# Patient Record
Sex: Male | Born: 1990 | Hispanic: Yes | Marital: Single | Smoking: Never smoker
Health system: Southern US, Community
[De-identification: ages and names within clinical notes are randomized; demographics above are authoritative.]

## PROBLEM LIST (undated history)

## (undated) DIAGNOSIS — T79A21A Traumatic compartment syndrome of right lower extremity, initial encounter: Secondary | ICD-10-CM

## (undated) DIAGNOSIS — S82251A Displaced comminuted fracture of shaft of right tibia, initial encounter for closed fracture: Secondary | ICD-10-CM

## (undated) HISTORY — DX: Traumatic compartment syndrome of right lower extremity, initial encounter: T79.A21A

## (undated) HISTORY — DX: Displaced comminuted fracture of shaft of right tibia, initial encounter for closed fracture: S82.251A

---

## 2010-01-06 ENCOUNTER — Emergency Department (HOSPITAL_COMMUNITY): Admission: EM | Admit: 2010-01-06 | Discharge: 2010-01-07 | Payer: Self-pay | Admitting: Emergency Medicine

## 2010-02-11 ENCOUNTER — Emergency Department (HOSPITAL_COMMUNITY): Admission: EM | Admit: 2010-02-11 | Discharge: 2010-02-12 | Payer: Self-pay | Admitting: Emergency Medicine

## 2011-02-01 LAB — CBC
HCT: 45.8 % (ref 39.0–52.0)
MCV: 90.2 fL (ref 78.0–100.0)
RBC: 5.07 MIL/uL (ref 4.22–5.81)
WBC: 5.7 10*3/uL (ref 4.0–10.5)

## 2011-02-01 LAB — RAPID URINE DRUG SCREEN, HOSP PERFORMED
Amphetamines: NOT DETECTED
Barbiturates: NOT DETECTED
Benzodiazepines: NOT DETECTED
Opiates: NOT DETECTED

## 2011-02-01 LAB — URINALYSIS, ROUTINE W REFLEX MICROSCOPIC
Bilirubin Urine: NEGATIVE
Protein, ur: NEGATIVE mg/dL
Urobilinogen, UA: 0.2 mg/dL (ref 0.0–1.0)

## 2011-02-01 LAB — BASIC METABOLIC PANEL
Chloride: 103 mEq/L (ref 96–112)
GFR calc Af Amer: >60 mL/min (ref >60)
Potassium: 3.7 mEq/L (ref 3.5–5.1)
Sodium: 139 mEq/L (ref 135–145)

## 2011-02-01 LAB — DIFFERENTIAL
Eosinophils Absolute: 0 10*3/uL (ref 0.0–0.7)
Eosinophils Relative: 0 % (ref 0–5)
Lymphs Abs: 1.4 10*3/uL (ref 0.7–4.0)
Monocytes Relative: 9 % (ref 3–12)

## 2011-02-01 LAB — ETHANOL: Alcohol, Ethyl (B): <5 mg/dL (ref 0–10)

## 2011-02-03 LAB — BASIC METABOLIC PANEL
CO2: 26 mEq/L (ref 19–32)
Chloride: 103 mEq/L (ref 96–112)
Creatinine, Ser: 0.89 mg/dL (ref 0.4–1.5)
GFR calc Af Amer: >60 mL/min (ref >60)
Potassium: 3.4 mEq/L — ABNORMAL LOW (ref 3.5–5.1)

## 2011-02-03 LAB — RAPID URINE DRUG SCREEN, HOSP PERFORMED
Amphetamines: NOT DETECTED
Opiates: NOT DETECTED
Tetrahydrocannabinol: POSITIVE — AB

## 2011-02-03 LAB — CBC
HCT: 46.8 % (ref 39.0–52.0)
MCHC: 34.1 g/dL (ref 30.0–36.0)
MCV: 91.9 fL (ref 78.0–100.0)
RBC: 5.09 MIL/uL (ref 4.22–5.81)

## 2011-02-03 LAB — DIFFERENTIAL
Basophils Relative: 1 % (ref 0–1)
Eosinophils Absolute: 0 10*3/uL (ref 0.0–0.7)
Eosinophils Relative: 0 % (ref 0–5)
Monocytes Absolute: 0.3 10*3/uL (ref 0.1–1.0)
Monocytes Relative: 6 % (ref 3–12)
Neutrophils Relative %: 72 % (ref 43–77)

## 2011-09-22 ENCOUNTER — Emergency Department (HOSPITAL_COMMUNITY): Payer: Medicaid Other

## 2011-09-22 ENCOUNTER — Encounter: Payer: Self-pay | Admitting: *Deleted

## 2011-09-22 ENCOUNTER — Emergency Department (HOSPITAL_COMMUNITY)
Admission: EM | Admit: 2011-09-22 | Discharge: 2011-09-22 | Disposition: A | Discharge status: Home / Self Care | Payer: Medicaid Other | Attending: Emergency Medicine | Admitting: Emergency Medicine

## 2011-09-22 DIAGNOSIS — S8392XA Sprain of unspecified site of left knee, initial encounter: Secondary | ICD-10-CM

## 2011-09-22 DIAGNOSIS — X58XXXA Exposure to other specified factors, initial encounter: Secondary | ICD-10-CM | POA: Insufficient documentation

## 2011-09-22 DIAGNOSIS — IMO0002 Reserved for concepts with insufficient information to code with codable children: Secondary | ICD-10-CM | POA: Insufficient documentation

## 2011-09-22 MED ORDER — DICLOFENAC SODIUM 75 MG PO TBEC: 75.0000 mg | DELAYED_RELEASE_TABLET | Freq: Two times a day (BID) | ORAL | Status: DC

## 2011-09-22 NOTE — ED Notes (Signed)
Pt a/ox4. Resp even and unlabored. NAD at this time. D/C instructions and Rx x1 reviewed with pt. Pt verbalized understanding. Pt ambulated to POV with steady gate.  

## 2011-09-22 NOTE — ED Notes (Signed)
Pt states left knee pain after playing basketball last night. States he landed wrong and heard a pop.

## 2011-09-22 NOTE — ED Notes (Signed)
Knee immobilizer applied to left knee.  

## 2011-09-22 NOTE — ED Provider Notes (Signed)
History     CSN: [id number] Arrival date & time: 09/22/2011  3:50 PM   First MD Initiated Contact with Patient 09/22/11 1602      Chief Complaint  Patient presents with  . Knee Pain    (Consider location/radiation/quality/duration/timing/severity/associated sxs/prior treatment) Patient is a 20 y.o. male presenting with knee pain. The history is provided by the patient.  Knee Pain This is a new problem. The current episode started yesterday. The problem has been unchanged. Associated symptoms include joint swelling. Pertinent negatives include no abdominal pain, arthralgias, chest pain, coughing, fever or neck pain. The symptoms are aggravated by bending, standing and walking. He has tried nothing for the symptoms.    History reviewed. No pertinent past medical history.  History reviewed. No pertinent past surgical history.  No family history on file.  History  Substance Use Topics  . Smoking status: Never Smoker   . Smokeless tobacco: Not on file  . Alcohol Use: No      Review of Systems  Constitutional: Negative for fever and activity change.       All ROS Neg except as noted in HPI  HENT: Negative for nosebleeds and neck pain.   Eyes: Negative for photophobia and discharge.  Respiratory: Negative for cough, shortness of breath and wheezing.   Cardiovascular: Negative for chest pain and palpitations.  Gastrointestinal: Negative for abdominal pain and blood in stool.  Genitourinary: Negative for dysuria, frequency and hematuria.  Musculoskeletal: Positive for joint swelling. Negative for back pain and arthralgias.  Skin: Negative.   Neurological: Negative for dizziness, seizures and speech difficulty.  Psychiatric/Behavioral: Negative for hallucinations and confusion.    Allergies  Review of patient's allergies indicates no known allergies.  Home Medications  No current outpatient prescriptions on file.  BP 134/55  Pulse 67  Temp(Src) 98.8 F (37.1 C) (Oral)   Resp 16  Ht 5\' 6"  (1.676 m)  Wt 198 lb (89.812 kg)  BMI 31.96 kg/m2  SpO2 100%  Physical Exam  Nursing note and vitals reviewed. Constitutional: He is oriented to person, place, and time. He appears well-developed and well-nourished.  Non-toxic appearance.  HENT:  Head: Normocephalic.  Right Ear: Tympanic membrane and external ear normal.  Left Ear: Tympanic membrane and external ear normal.  Eyes: EOM and lids are normal. Pupils are equal, round, and reactive to light.  Neck: Normal range of motion. Neck supple. Carotid bruit is not present.  Cardiovascular: Normal rate, regular rhythm, normal heart sounds, intact distal pulses and normal pulses.   Pulmonary/Chest: Breath sounds normal. No respiratory distress.  Abdominal: Soft. Bowel sounds are normal. There is no tenderness. There is no guarding.  Musculoskeletal: Normal range of motion.       Mild effusion noted or the left knee.. Not hot. Pain with flexing the left knee. No posterior mass. No significant instability on limited exam. Distal pulse and sensory wnl.  Lymphadenopathy:       Head (right side): No submandibular adenopathy present.       Head (left side): No submandibular adenopathy present.    He has no cervical adenopathy.  Neurological: He is alert and oriented to person, place, and time. He has normal strength. No cranial nerve deficit or sensory deficit.  Skin: Skin is warm and dry.  Psychiatric: He has a normal mood and affect. His speech is normal.    ED Course: Pt to use knee immobilizer for 7 to 10 days. He is to see orthopedics if not improving. Rx  for diclofenac given.  Procedures (including critical care time)  Labs Reviewed - No data to display Dg Knee Complete 4 Views Left  09/22/2011  *RADIOLOGY REPORT*  Clinical Data: Basketball injury, left knee pain  LEFT KNEE - COMPLETE 4+ VIEW  Comparison: None.  Findings: No fracture or dislocation of the left knee.  There is a small suprapatellar joint effusion.   IMPRESSION:  1.  No evidence of fracture. 2. Small l effusion.  Original Report Authenticated By: Genevive Bi, M.D.     Dx: Left knee strain   MDM  I have reviewed nursing notes, vital signs, and all appropriate lab and imaging results for this patient.        Kathie Dike, [place] 09/22/11 1719

## 2011-09-22 NOTE — ED Provider Notes (Signed)
Medical screening examination/treatment/procedure(s) were performed by non-physician practitioner and as supervising physician I was immediately available for consultation/collaboration.   Joya Gaskins, MD 09/22/11 [phone]

## 2012-03-25 ENCOUNTER — Encounter (HOSPITAL_COMMUNITY): Payer: Self-pay | Admitting: *Deleted

## 2012-03-25 ENCOUNTER — Emergency Department (HOSPITAL_COMMUNITY)
Admission: EM | Admit: 2012-03-25 | Discharge: 2012-03-25 | Disposition: A | Discharge status: Home / Self Care | Payer: Self-pay | Attending: Emergency Medicine | Admitting: Emergency Medicine

## 2012-03-25 DIAGNOSIS — L309 Dermatitis, unspecified: Secondary | ICD-10-CM

## 2012-03-25 DIAGNOSIS — L259 Unspecified contact dermatitis, unspecified cause: Secondary | ICD-10-CM | POA: Insufficient documentation

## 2012-03-25 MED ORDER — AMOXICILLIN-POT CLAVULANATE 875-125 MG PO TABS
1.0000 | ORAL_TABLET | Freq: Once | ORAL | Status: AC
  Administered 2012-03-25: 1 via ORAL
  Filled 2012-03-25: qty 1

## 2012-03-25 MED ORDER — AMOXICILLIN-POT CLAVULANATE 875-125 MG PO TABS: 1.0000 | ORAL_TABLET | Freq: Two times a day (BID) | ORAL | Status: AC

## 2012-03-25 MED ORDER — TRIAMCINOLONE ACETONIDE 0.5 % EX CREA: TOPICAL_CREAM | Freq: Three times a day (TID) | CUTANEOUS | Status: AC

## 2012-03-25 NOTE — Discharge Instructions (Signed)
Dermatitis Hand Dermatitis Hand dermatitis is a skin problem. Small, itchy, raised dots or blisters appear on the palms of the hands. Hand dermatitis can last 3 to 4 weeks. HOME CARE  Avoid washing your hands too much.   Avoid all harsh chemicals. Wear gloves when you use products that can bother your skin.   Use medicated cream (1% hydrocortisone cream) at least 2 to 4 times per day.   Only take medicine as told by your doctor.   You may use wet cloths (compresses) or cold packs.  GET HELP RIGHT AWAY IF:   The rash is not better after 1 week of treatment.   The area is red, tender, or yellowish-white fluid (pus) comes from the wound.   The rash is spreading.  MAKE SURE YOU:   Understand these instructions.   Will watch your condition.   Will get help right away if you are not doing well or get worse.  Document Released: 01/24/2010 Document Revised: 10/19/2011 Document Reviewed: 03/29/2011 [facility] Patient Information 2012 [place], [place].   Apply the triamcinolone cream as directed after washing hands with soap and water.  The antibiotic prescribed is to hopefully prevent any secondary infection from developing.  Call [facility] at [phone] and make an appt for further evaluation.

## 2012-03-25 NOTE — ED Notes (Signed)
Pt with swelling noted to right hand fingers, pt states he got an abrasion while playing basketball 2-3 weeks ago, hx of same about 8 months ago per pt and was seen at Urgent care x 2 for it and it went away per pt., c/o itching and burning to palm of right hand and fingers

## 2012-03-25 NOTE — ED Provider Notes (Signed)
History     CSN: [id number]  Arrival date & time 03/25/12  1650   First MD Initiated Contact with Patient 03/25/12 1914      Chief Complaint  Patient presents with  . Rash    (Consider location/radiation/quality/duration/timing/severity/associated sxs/prior treatment) HPI Comments: States this is the third or fourth flare-up with the hand rash in about 1.5 yrs.   No known allergen contact.  Has been treating with daily hydrogen peroxide washes and benadryl cream without improvement.    Patient is a 21 y.o. male presenting with rash. The history is provided by the patient. No language interpreter was used.  Rash  This is a recurrent problem.    History reviewed. No pertinent past medical history.  History reviewed. No pertinent past surgical history.  History reviewed. No pertinent family history.  History  Substance Use Topics  . Smoking status: Never Smoker   . Smokeless tobacco: Not on file  . Alcohol Use: No      Review of Systems  Constitutional: Negative for fever and chills.  Skin: Positive for rash.  All other systems reviewed and are negative.    Allergies  Review of patient's allergies indicates no known allergies.  Home Medications   Current Outpatient Rx  Name Route Sig Dispense Refill  . AMOXICILLIN-POT CLAVULANATE 875-125 MG PO TABS Oral Take 1 tablet by mouth every 12 (twelve) hours. 14 tablet 0  . TRIAMCINOLONE ACETONIDE 0.5 % EX CREA Topical Apply topically 3 (three) times daily. 30 g 0    BP 144/74  Pulse 70  Temp(Src) 98.2 F (36.8 C) (Oral)  Resp 20  Ht 5\' 6"  (1.676 m)  Wt 185 lb (83.915 kg)  BMI 29.86 kg/m2  SpO2 100%  Physical Exam  Nursing note and vitals reviewed. Constitutional: He is oriented to person, place, and time. He appears well-developed and well-nourished.  HENT:  Head: Normocephalic and atraumatic.  Eyes: EOM are normal.  Neck: Normal range of motion.  Cardiovascular: Normal rate, regular rhythm, normal heart  sounds and intact distal pulses.   Pulmonary/Chest: Effort normal and breath sounds normal. No respiratory distress.  Abdominal: Soft. He exhibits no distension. There is no tenderness.  Musculoskeletal:       Right shoulder: He exhibits no bony tenderness.       Right hand: He exhibits decreased range of motion, tenderness and swelling. He exhibits no bony tenderness, normal two-point discrimination, normal capillary refill, no deformity and no laceration.       Hands:      Tiny blistery lesions which are nearly confluent on an erythematous base.  + pruritic.  No evidence of secondary infection.  + moderate finger swelling.  Neurological: He is alert and oriented to person, place, and time.  Skin: Skin is warm and dry. Rash noted.  Psychiatric: He has a normal mood and affect. Judgment normal.    ED Course  Procedures (including critical care time)  Labs Reviewed - No data to display No results found.   1. Dermatitis       MDM  rx triamcinolone cream, 0.5 % BID.  Wash BID.  Referral to dermatology, dr. Margo Aye.        Worthy Rancher, PA 03/25/12 2012  Worthy Rancher, PA 03/25/12 2014

## 2012-03-25 NOTE — ED Notes (Signed)
Bil hand rash that itches.

## 2012-03-28 NOTE — ED Provider Notes (Signed)
Medical screening examination/treatment/procedure(s) were performed by non-physician practitioner and as supervising physician I was immediately available for consultation/collaboration.  Remmy Crass, MD 03/28/12 0716 

## 2013-09-01 ENCOUNTER — Emergency Department (HOSPITAL_COMMUNITY): Payer: No Typology Code available for payment source

## 2013-09-01 ENCOUNTER — Emergency Department (HOSPITAL_COMMUNITY)
Admission: EM | Admit: 2013-09-01 | Discharge: 2013-09-01 | Disposition: A | Discharge status: Home / Self Care | Payer: No Typology Code available for payment source | Attending: Emergency Medicine | Admitting: Emergency Medicine

## 2013-09-01 ENCOUNTER — Encounter (HOSPITAL_COMMUNITY): Payer: Self-pay | Admitting: Emergency Medicine

## 2013-09-01 DIAGNOSIS — S0081XA Abrasion of other part of head, initial encounter: Secondary | ICD-10-CM

## 2013-09-01 DIAGNOSIS — Y9389 Activity, other specified: Secondary | ICD-10-CM | POA: Insufficient documentation

## 2013-09-01 DIAGNOSIS — S0033XA Contusion of nose, initial encounter: Secondary | ICD-10-CM

## 2013-09-01 DIAGNOSIS — IMO0002 Reserved for concepts with insufficient information to code with codable children: Secondary | ICD-10-CM | POA: Insufficient documentation

## 2013-09-01 DIAGNOSIS — S0003XA Contusion of scalp, initial encounter: Secondary | ICD-10-CM | POA: Insufficient documentation

## 2013-09-01 DIAGNOSIS — Y92410 Unspecified street and highway as the place of occurrence of the external cause: Secondary | ICD-10-CM | POA: Insufficient documentation
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
## 2013-09-01 NOTE — ED Notes (Signed)
Discharge instructions given and reviewed with patient.  Patient verbalized understanding to use ice and Ibuprofen as directed.  Patient ambulatory; discharged home in good condition.
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
## 2013-09-11 ENCOUNTER — Emergency Department (HOSPITAL_COMMUNITY)
Admission: EM | Admit: 2013-09-11 | Discharge: 2013-09-11 | Disposition: A | Discharge status: Home / Self Care | Payer: No Typology Code available for payment source | Attending: Emergency Medicine | Admitting: Emergency Medicine

## 2013-09-11 ENCOUNTER — Encounter (HOSPITAL_COMMUNITY): Payer: Self-pay | Admitting: Emergency Medicine

## 2013-09-11 DIAGNOSIS — M25569 Pain in unspecified knee: Secondary | ICD-10-CM | POA: Insufficient documentation

## 2013-09-11 DIAGNOSIS — M25561 Pain in right knee: Secondary | ICD-10-CM

## 2013-09-11 DIAGNOSIS — G8911 Acute pain due to trauma: Secondary | ICD-10-CM | POA: Insufficient documentation

## 2013-09-11 NOTE — ED Notes (Signed)
Patient with no complaints at this time. Respirations even and unlabored. Skin warm/dry. Discharge instructions reviewed with patient at this time. Patient given opportunity to voice concerns/ask questions. Patient discharged at this time and left Emergency Department with steady gait.   

## 2013-09-11 NOTE — ED Notes (Signed)
Wants recheck from [facility] on 09-01-2013
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
## 2013-09-12 NOTE — ED Provider Notes (Signed)
Medical screening examination/treatment/procedure(s) were performed by non-physician practitioner and as supervising physician I was immediately available for consultation/collaboration.  EKG Interpretation   None        Braison Snoke, MD 09/12/13 2342 

## 2013-10-06 ENCOUNTER — Ambulatory Visit: Payer: Self-pay | Admitting: Orthopedic Surgery

## 2013-10-14 ENCOUNTER — Ambulatory Visit (INDEPENDENT_AMBULATORY_CARE_PROVIDER_SITE_OTHER): Payer: No Typology Code available for payment source

## 2013-10-14 ENCOUNTER — Encounter: Payer: Self-pay | Admitting: Orthopedic Surgery

## 2013-10-14 ENCOUNTER — Ambulatory Visit (INDEPENDENT_AMBULATORY_CARE_PROVIDER_SITE_OTHER): Payer: Self-pay | Admitting: Orthopedic Surgery

## 2013-10-14 VITALS — BP 128/102 | Ht 66.0 in | Wt 215.0 lb

## 2013-10-14 DIAGNOSIS — M25561 Pain in right knee: Secondary | ICD-10-CM

## 2013-10-14 DIAGNOSIS — M25569 Pain in unspecified knee: Secondary | ICD-10-CM

## 2013-10-14 MED ORDER — NAPROXEN 500 MG PO TABS: 500.0000 mg | ORAL_TABLET | Freq: Two times a day (BID) | ORAL | Status: DC
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
## 2013-10-14 NOTE — Patient Instructions (Addendum)
Call to [facility] to arrange therapy Pick up your prescription at [facility]

## 2013-10-22 ENCOUNTER — Ambulatory Visit (HOSPITAL_COMMUNITY): Payer: Self-pay | Admitting: Physical Therapy

## 2013-10-29 ENCOUNTER — Ambulatory Visit (HOSPITAL_COMMUNITY)
Admission: RE | Admit: 2013-10-29 | Discharge: 2013-10-29 | Disposition: A | Discharge status: Home / Self Care | Payer: No Typology Code available for payment source | Source: Ambulatory Visit | Attending: Orthopedic Surgery | Admitting: Orthopedic Surgery

## 2013-10-29 DIAGNOSIS — M25569 Pain in unspecified knee: Secondary | ICD-10-CM | POA: Insufficient documentation

## 2013-10-29 DIAGNOSIS — R269 Unspecified abnormalities of gait and mobility: Secondary | ICD-10-CM | POA: Insufficient documentation

## 2013-10-29 DIAGNOSIS — IMO0001 Reserved for inherently not codable concepts without codable children: Secondary | ICD-10-CM | POA: Insufficient documentation
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
## 2014-11-30 ENCOUNTER — Emergency Department (HOSPITAL_COMMUNITY)
Admission: EM | Admit: 2014-11-30 | Discharge: 2014-11-30 | Disposition: A | Discharge status: Home / Self Care | Payer: 59 | Attending: Emergency Medicine | Admitting: Emergency Medicine

## 2014-11-30 ENCOUNTER — Encounter (HOSPITAL_COMMUNITY): Payer: Self-pay | Admitting: Emergency Medicine

## 2014-11-30 DIAGNOSIS — Y9289 Other specified places as the place of occurrence of the external cause: Secondary | ICD-10-CM | POA: Insufficient documentation

## 2014-11-30 DIAGNOSIS — Y9389 Activity, other specified: Secondary | ICD-10-CM | POA: Insufficient documentation

## 2014-11-30 DIAGNOSIS — Y998 Other external cause status: Secondary | ICD-10-CM | POA: Diagnosis not present

## 2014-11-30 DIAGNOSIS — Z791 Long term (current) use of non-steroidal anti-inflammatories (NSAID): Secondary | ICD-10-CM | POA: Diagnosis not present

## 2014-11-30 DIAGNOSIS — S29012A Strain of muscle and tendon of back wall of thorax, initial encounter: Secondary | ICD-10-CM | POA: Diagnosis not present

## 2014-11-30 DIAGNOSIS — S4992XA Unspecified injury of left shoulder and upper arm, initial encounter: Secondary | ICD-10-CM | POA: Insufficient documentation

## 2014-11-30 DIAGNOSIS — S299XXA Unspecified injury of thorax, initial encounter: Secondary | ICD-10-CM | POA: Diagnosis present

## 2014-11-30 DIAGNOSIS — X58XXXA Exposure to other specified factors, initial encounter: Secondary | ICD-10-CM | POA: Diagnosis not present

## 2014-11-30 MED ORDER — TRAMADOL HCL 50 MG PO TABS: 50.0000 mg | ORAL_TABLET | Freq: Four times a day (QID) | ORAL | Status: DC | PRN

## 2014-11-30 MED ORDER — CYCLOBENZAPRINE HCL 10 MG PO TABS: 10.0000 mg | ORAL_TABLET | Freq: Three times a day (TID) | ORAL | Status: DC | PRN

## 2014-11-30 MED ORDER — IBUPROFEN 800 MG PO TABS: 800.0000 mg | ORAL_TABLET | Freq: Three times a day (TID) | ORAL | Status: DC

## 2014-11-30 NOTE — ED Notes (Signed)
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

## 2014-11-30 NOTE — ED Notes (Signed)
Pt alert & oriented x4, stable gait. Patient given discharge instructions, paperwork & prescription(s). Patient  instructed to stop at the registration desk to finish any additional paperwork. Patient verbalized understanding. Pt left department w/ no further questions. 

## 2014-11-30 NOTE — Discharge Instructions (Signed)

## 2014-11-30 NOTE — ED Provider Notes (Signed)
CSN: [id number]     Arrival date & time 11/30/14  1518 History  This chart was scribed for non-physician practitioner, Pauline Aus, PA-C, working with Juliet Rude. Rubin Payor, MD, by Ronney Lion, ED Scribe. This patient was seen in room [facility] and the patient's care was started at 4:29 PM.    Chief Complaint  Patient presents with  . Back Pain   Patient is a 24 y.o. male presenting with back pain. The history is provided by the patient. No language interpreter was used.  Back Pain Location:  Thoracic spine Quality:  Aching Radiates to:  Does not radiate Pain severity:  Mild Onset quality:  Gradual Duration:  6 months Timing:  Intermittent Progression:  Worsening Chronicity:  Chronic Relieved by:  Bed rest, heating pad and cold packs Worsened by:  Nothing tried Ineffective treatments:  None tried Associated symptoms: no abdominal pain, no dysuria, no fever, no numbness and no weakness      HPI Comments: Carlos Kim is a 24 y.o. male who presents to the Emergency Department complaining of non-radiating, intermittent left mid back pain that has been ongoing since July 2015. It is usually alleviated after a weekend of rest, but it persisted this weekend. Patient complains of  sore pain in his left shoulder, and bilateral rib pain exacerbated by stretching. Bending over or straightening his back exacerbates his back pain. Deep inspiration doesn't affect it. He denies trying any medication. Patient does heavy lifting for work at a [employer]. He denies SOB, fever, chills, urinary or bladder changes, neck pain or lower extremity weakness.    History reviewed. No pertinent past medical history. No past surgical history on file. No family history on file. History  Substance Use Topics  . Smoking status: Never Smoker   . Smokeless tobacco: Not on file  . Alcohol Use: Yes     Comment: socially    Review of Systems  Constitutional: Negative  for fever.  Respiratory: Negative for shortness of breath.   Gastrointestinal: Negative for vomiting, abdominal pain and constipation.  Genitourinary: Negative for dysuria, hematuria, flank pain, decreased urine volume and difficulty urinating.  Musculoskeletal: Positive for myalgias and back pain. Negative for joint swelling.  Skin: Negative for rash.  Neurological: Negative for weakness and numbness.  All other systems reviewed and are negative.     Allergies  Review of patient's allergies indicates no known allergies.  Home Medications   Prior to Admission medications   Medication Sig Start Date End Date Taking? Authorizing Provider  naproxen (NAPROSYN) 500 MG tablet Take 1 tablet (500 mg total) by mouth 2 (two) times daily with a meal. Patient not taking: Reported on 11/30/2014 10/14/13   Vickki Hearing, MD   BP 132/78 mmHg  Pulse 92  Temp(Src) 99.7 F (37.6 C) (Oral)  Resp 18  Ht  (1.676 m)  Wt 185 lb (83.915 kg)  BMI 29.87 kg/m2  SpO2 100% Physical Exam  Constitutional: He is oriented to person, place, and time. He appears well-developed and well-nourished. No distress.  HENT:  Head: Normocephalic and atraumatic.  Eyes: Conjunctivae and EOM are normal.  Neck: Neck supple. No tracheal deviation present.  Cardiovascular: Normal rate, regular rhythm and normal heart sounds.   Pulmonary/Chest: Effort normal. No respiratory distress.  Musculoskeletal: Normal range of motion. He exhibits tenderness.  Tenderness with rotation of left shoulder. Tenderness to left scapula border. No spinal tenderness.  Neurological: He is alert and oriented to person, place, and  time.  Skin: Skin is warm and dry.  Psychiatric: He has a normal mood and affect. His behavior is normal.  Nursing note and vitals reviewed.   ED Course  Procedures (including critical care time)  DIAGNOSTIC STUDIES: Oxygen Saturation is 100% on room air, normal by my interpretation.    COORDINATION OF  CARE: 4:36 PM - Discussed treatment plan with pt at bedside which includes ice, anti-inflammatory medication, and pain medication, and pt agreed to plan. Patient advised to return if he umbness, weakness   MDM   Final diagnoses:  Muscle strain of left upper back, initial encounter   Pt is well appearing, VSS.  Pain likely related to musculoskeletal injury.  Agrees to symptomatic tx and close PMD f/u or to return here if sx's worsen.   I personally performed the services described in this documentation, which was scribed in my presence. The recorded information has been reviewed and is accurate.    Odester Nilson L. Trisha Mangleriplett, PA-C 12/03/14 1419  Nathan R. Rubin PayorPickering, MD 12/04/14 2249

## 2016-12-27 ENCOUNTER — Emergency Department (HOSPITAL_COMMUNITY)
Admission: EM | Admit: 2016-12-27 | Discharge: 2016-12-27 | Disposition: A | Discharge status: Home / Self Care | Payer: 59 | Attending: Emergency Medicine | Admitting: Emergency Medicine

## 2016-12-27 ENCOUNTER — Encounter (HOSPITAL_COMMUNITY): Payer: Self-pay | Admitting: Emergency Medicine

## 2016-12-27 ENCOUNTER — Emergency Department (HOSPITAL_COMMUNITY): Payer: 59

## 2016-12-27 DIAGNOSIS — Y998 Other external cause status: Secondary | ICD-10-CM | POA: Insufficient documentation

## 2016-12-27 DIAGNOSIS — X58XXXA Exposure to other specified factors, initial encounter: Secondary | ICD-10-CM | POA: Insufficient documentation

## 2016-12-27 DIAGNOSIS — S8392XA Sprain of unspecified site of left knee, initial encounter: Secondary | ICD-10-CM | POA: Insufficient documentation

## 2016-12-27 DIAGNOSIS — Y929 Unspecified place or not applicable: Secondary | ICD-10-CM | POA: Insufficient documentation

## 2016-12-27 DIAGNOSIS — Y9339 Activity, other involving climbing, rappelling and jumping off: Secondary | ICD-10-CM | POA: Insufficient documentation

## 2016-12-27 MED ORDER — IBUPROFEN 800 MG PO TABS: 800.0000 mg | ORAL_TABLET | Freq: Three times a day (TID) | ORAL | 0 refills | Status: DC

## 2016-12-27 NOTE — Discharge Instructions (Signed)
Elevate apply ice packs on/off to your knee.  Minimal weight bearing.  Call one of the providers listed to arrange a follow-up appt.

## 2016-12-27 NOTE — ED Triage Notes (Signed)
Pt c/o left knee pain and swelling since jumping off back of a truck on Monday.
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
## 2017-09-09 ENCOUNTER — Encounter (HOSPITAL_COMMUNITY): Payer: Self-pay

## 2017-09-09 ENCOUNTER — Emergency Department (HOSPITAL_COMMUNITY)
Admission: EM | Admit: 2017-09-09 | Discharge: 2017-09-09 | Disposition: A | Discharge status: Home / Self Care | Payer: 59 | Attending: Emergency Medicine | Admitting: Emergency Medicine

## 2017-09-09 DIAGNOSIS — L237 Allergic contact dermatitis due to plants, except food: Secondary | ICD-10-CM | POA: Insufficient documentation

## 2017-09-09 DIAGNOSIS — Z79899 Other long term (current) drug therapy: Secondary | ICD-10-CM | POA: Diagnosis not present

## 2017-09-09 DIAGNOSIS — R600 Localized edema: Secondary | ICD-10-CM | POA: Diagnosis present

## 2017-09-09 DIAGNOSIS — L255 Unspecified contact dermatitis due to plants, except food: Secondary | ICD-10-CM

## 2017-09-09 MED ORDER — PREDNISONE 10 MG PO TABS: ORAL_TABLET | ORAL | 0 refills | Status: DC

## 2017-09-09 MED ORDER — PREDNISONE 50 MG PO TABS
60.0000 mg | ORAL_TABLET | Freq: Once | ORAL | Status: AC
  Administered 2017-09-09: 60 mg via ORAL
  Filled 2017-09-09: qty 1

## 2017-09-09 NOTE — ED Triage Notes (Signed)
Patient reports of possible exposure to poison oak Thursday and started with rash yesterday. States he woke up with swelling to face this morning. Denies any respiratory issues.

## 2017-09-09 NOTE — ED Provider Notes (Signed)
[facility] Provider Note   CSN: [id number] Arrival date & time: 09/09/17  0703     History   Chief Complaint Chief Complaint  Patient presents with  . Facial Swelling    HPI Lorelee MarketDelfino E Sellin is a 26 y.o. male.  HPI  Pt was seen at 0720. Per pt, c/o gradual onset and persistence of constant "rash" since yesterday. Before the rash began, pt states he was outside "chopping up trees" and was probably exposed to poison ivy/oak. States the rash started on his volar forearms and now has spread to his face. Denies SOB/wheezing, no intra-oral edema, no stridor.   History reviewed. No pertinent past medical history.  Patient Active Problem List   Diagnosis Date Noted  . Bilateral anterior knee pain 10/14/2013  . Bilateral knee pain 10/14/2013    History reviewed. No pertinent surgical history.     Home Medications    Prior to Admission medications   Medication Sig Start Date End Date Taking? Authorizing Provider  cyclobenzaprine (FLEXERIL) 10 MG tablet Take 1 tablet (10 mg total) by mouth 3 (three) times daily as needed. 11/30/14   Triplett, Tammy, PA-C  ibuprofen (ADVIL,MOTRIN) 800 MG tablet Take 1 tablet (800 mg total) by mouth 3 (three) times daily. 12/27/16   Triplett, Tammy, PA-C  traMADol (ULTRAM) 50 MG tablet Take 1 tablet (50 mg total) by mouth every 6 (six) hours as needed. 11/30/14   Pauline Ausriplett, Tammy, PA-C    Family History No family history on file.  Social History Social History  Substance Use Topics  . Smoking status: Never Smoker  . Smokeless tobacco: Never Used  . Alcohol use Yes     Comment: socially     Allergies   Patient has no known allergies.   Review of Systems Review of Systems ROS: Statement: All systems negative except as marked or noted in the HPI; Constitutional: Negative for fever and chills. ; ; Eyes: Negative for eye pain, redness and discharge. ; ; ENMT: Negative for ear pain, hoarseness, nasal congestion, sinus  pressure and sore throat. ; ; Cardiovascular: Negative for chest pain, palpitations, diaphoresis, dyspnea and peripheral edema. ; ; Respiratory: Negative for cough, wheezing and stridor. ; ; Gastrointestinal: Negative for nausea, vomiting, diarrhea, abdominal pain, blood in stool, hematemesis, jaundice and rectal bleeding. . ; ; Genitourinary: Negative for dysuria, flank pain and hematuria. ; ; Musculoskeletal: Negative for back pain and neck pain. Negative for swelling and trauma.; ; Skin: +itching rash. Negative for abrasions, blisters, bruising and skin lesion.; ; Neuro: Negative for headache, lightheadedness and neck stiffness. Negative for weakness, altered level of consciousness, altered mental status, extremity weakness, paresthesias, involuntary movement, seizure and syncope.       Physical Exam Updated Vital Signs BP (!) 133/92   Pulse 79   Temp 97.9 F (36.6 C) (Oral)   Resp 17   Ht 5\' 6"  (1.676 m)   Wt 99.8 kg (220 lb)   SpO2 100%   BMI 35.51 kg/m   Physical Exam 0725: Physical examination:  Nursing notes reviewed; Vital signs and O2 SAT reviewed;  Constitutional: Well developed, Well nourished, Well hydrated, In no acute distress; Head:  Normocephalic, atraumatic. +papulovesicular rash to forehead, cheeks.; Eyes: EOMI, PERRL, No scleral icterus; ENMT: +edemetous nasal turbinates bilat with clear rhinorrhea. Mouth and pharynx without lesions. No tonsillar exudates. No intra-oral edema. No submandibular or sublingual edema. No hoarse voice, no drooling, no stridor. No pain with manipulation of larynx. No trismus. Mouth and  pharynx normal, Mucous membranes moist; Neck: Supple, Full range of motion, No lymphadenopathy; Cardiovascular: Regular rate and rhythm, No gallop; Respiratory: Breath sounds clear & equal bilaterally, No wheezes.  Speaking full sentences with ease, Normal respiratory effort/excursion; Chest: Nontender, Movement normal; Abdomen: Soft, Nontender, Nondistended, Normal  bowel sounds; Genitourinary: No CVA tenderness; Extremities: Pulses normal, No tenderness, No edema, No calf edema or asymmetry. +papulovesicular rash with linear excoriations to bilat volar > dorsal forearms..; Neuro: AA&Ox3, Major CN grossly intact.  Speech clear. No gross focal motor or sensory deficits in extremities. Climbs on and off stretcher easily by himself. Gait steady..; Skin: Color normal, Warm, Dry.   ED Treatments / Results  Labs (all labs ordered are listed, but only abnormal results are displayed)   EKG  EKG Interpretation None       Radiology   Procedures Procedures (including critical care time)  Medications Ordered in ED Medications  predniSONE (DELTASONE) tablet 60 mg (not administered)     Initial Impression / Assessment and Plan / ED Course  I have reviewed the triage vital signs and the nursing notes.  Pertinent labs & imaging results that were available during my care of the patient were reviewed by me and considered in my medical decision making (see chart for details).  MDM Reviewed: previous chart, nursing note and vitals   0730:  Likely exposure to poison ivy/oak while cutting up trees outside. Tx symptomatically. Dx d/w pt.  Questions answered.  Verb understanding, agreeable to d/c home with outpt f/u.   Final Clinical Impressions(s) / ED Diagnoses   Final diagnoses:  None    New Prescriptions New Prescriptions   No medications on file     Samuel Jester, DO 09/13/17 2142

## 2017-09-09 NOTE — Discharge Instructions (Signed)
Take the prescription as directed.  Take over the counter benadryl, as directed on packaging, as needed for itching.  If the benadryl is too sedating, take an over the counter non-sedating antihistamine such as claritin, allegra or zyrtec, as directed on packaging.  Call your regular medical doctor on Monday to schedule a follow up appointment this week.  Return to the Emergency Department immediately sooner if worsening.

## 2017-11-12 ENCOUNTER — Emergency Department (HOSPITAL_COMMUNITY): Payer: 59

## 2017-11-12 ENCOUNTER — Encounter (HOSPITAL_COMMUNITY): Payer: Self-pay | Admitting: Emergency Medicine

## 2017-11-12 ENCOUNTER — Inpatient Hospital Stay (HOSPITAL_COMMUNITY)
Admission: EM | Admit: 2017-11-12 | Discharge: 2017-11-16 | DRG: 493 | Disposition: A | Discharge status: Home Health | Payer: 59 | Attending: Surgery | Admitting: Surgery

## 2017-11-12 DIAGNOSIS — S82241A Displaced spiral fracture of shaft of right tibia, initial encounter for closed fracture: Principal | ICD-10-CM | POA: Diagnosis present

## 2017-11-12 DIAGNOSIS — E669 Obesity, unspecified: Secondary | ICD-10-CM | POA: Diagnosis present

## 2017-11-12 DIAGNOSIS — R310 Gross hematuria: Secondary | ICD-10-CM | POA: Diagnosis present

## 2017-11-12 DIAGNOSIS — Y92410 Unspecified street and highway as the place of occurrence of the external cause: Secondary | ICD-10-CM

## 2017-11-12 DIAGNOSIS — S82871A Displaced pilon fracture of right tibia, initial encounter for closed fracture: Secondary | ICD-10-CM

## 2017-11-12 DIAGNOSIS — Y908 Blood alcohol level of 240 mg/100 ml or more: Secondary | ICD-10-CM | POA: Diagnosis present

## 2017-11-12 DIAGNOSIS — S82251A Displaced comminuted fracture of shaft of right tibia, initial encounter for closed fracture: Secondary | ICD-10-CM

## 2017-11-12 DIAGNOSIS — F10129 Alcohol abuse with intoxication, unspecified: Secondary | ICD-10-CM | POA: Diagnosis present

## 2017-11-12 DIAGNOSIS — S3722XA Contusion of bladder, initial encounter: Secondary | ICD-10-CM | POA: Diagnosis present

## 2017-11-12 DIAGNOSIS — M79604 Pain in right leg: Secondary | ICD-10-CM | POA: Diagnosis present

## 2017-11-12 DIAGNOSIS — S32000A Wedge compression fracture of unspecified lumbar vertebra, initial encounter for closed fracture: Secondary | ICD-10-CM

## 2017-11-12 DIAGNOSIS — Z9119 Patient's noncompliance with other medical treatment and regimen: Secondary | ICD-10-CM | POA: Diagnosis not present

## 2017-11-12 DIAGNOSIS — T79A21A Traumatic compartment syndrome of right lower extremity, initial encounter: Secondary | ICD-10-CM

## 2017-11-12 DIAGNOSIS — S22000A Wedge compression fracture of unspecified thoracic vertebra, initial encounter for closed fracture: Secondary | ICD-10-CM

## 2017-11-12 DIAGNOSIS — Z6835 Body mass index (BMI) 35.0-35.9, adult: Secondary | ICD-10-CM | POA: Diagnosis not present

## 2017-11-12 LAB — COMPREHENSIVE METABOLIC PANEL
ALBUMIN: 4.3 g/dL (ref 3.5–5.0)
ALK PHOS: 63 U/L (ref 38–126)
ALT: 90 U/L — ABNORMAL HIGH (ref 17–63)
ANION GAP: 8 (ref 5–15)
AST: 68 U/L — ABNORMAL HIGH (ref 15–41)
BUN: 12 mg/dL (ref 6–20)
CHLORIDE: 106 mmol/L (ref 101–111)
CO2: 25 mmol/L (ref 22–32)
Calcium: 8.9 mg/dL (ref 8.9–10.3)
Creatinine, Ser: 0.9 mg/dL (ref 0.61–1.24)
GFR calc non Af Amer: >60 mL/min (ref >60)
GLUCOSE: 96 mg/dL (ref 65–99)
POTASSIUM: 3.8 mmol/L (ref 3.5–5.1)
SODIUM: 139 mmol/L (ref 135–145)
Total Bilirubin: 0.6 mg/dL (ref 0.3–1.2)
Total Protein: 7.4 g/dL (ref 6.5–8.1)

## 2017-11-12 LAB — I-STAT CHEM 8, ED
BUN: 12 mg/dL (ref 6–20)
CALCIUM ION: 1.1 mmol/L — AB (ref 1.15–1.40)
CHLORIDE: 104 mmol/L (ref 101–111)
Creatinine, Ser: 1.3 mg/dL — ABNORMAL HIGH (ref 0.61–1.24)
GLUCOSE: 97 mg/dL (ref 65–99)
HCT: 46 % (ref 39.0–52.0)
HEMOGLOBIN: 15.6 g/dL (ref 13.0–17.0)
Potassium: 3.7 mmol/L (ref 3.5–5.1)
SODIUM: 141 mmol/L (ref 135–145)
TCO2: 25 mmol/L (ref 22–32)

## 2017-11-12 LAB — PROTIME-INR
INR: 0.91
PROTHROMBIN TIME: 12.2 s (ref 11.4–15.2)

## 2017-11-12 LAB — CBC
HEMATOCRIT: 43.9 % (ref 39.0–52.0)
HEMOGLOBIN: 15.1 g/dL (ref 13.0–17.0)
MCH: 31.2 pg (ref 26.0–34.0)
MCHC: 34.4 g/dL (ref 30.0–36.0)
MCV: 90.7 fL (ref 78.0–100.0)
Platelets: 229 10*3/uL (ref 150–400)
RBC: 4.84 MIL/uL (ref 4.22–5.81)
RDW: 12.9 % (ref 11.5–15.5)
WBC: 7.7 10*3/uL (ref 4.0–10.5)

## 2017-11-12 LAB — I-STAT CG4 LACTIC ACID, ED: Lactic Acid, Venous: 1.93 mmol/L — ABNORMAL HIGH (ref 0.5–1.9)

## 2017-11-12 LAB — URINALYSIS, ROUTINE W REFLEX MICROSCOPIC
BILIRUBIN URINE: NEGATIVE
GLUCOSE, UA: NEGATIVE mg/dL
KETONES UR: NEGATIVE mg/dL
LEUKOCYTES UA: NEGATIVE
Nitrite: NEGATIVE
PH: 6 (ref 5.0–8.0)
PROTEIN: NEGATIVE mg/dL
Specific Gravity, Urine: 1.005 (ref 1.005–1.030)

## 2017-11-12 LAB — SAMPLE TO BLOOD BANK

## 2017-11-12 LAB — ETHANOL: ALCOHOL ETHYL (B): 252 mg/dL — AB (ref <10)

## 2017-11-12 MED ORDER — HYDROMORPHONE HCL 1 MG/ML IJ SOLN: 1.0000 mg | INTRAMUSCULAR | Status: DC | PRN

## 2017-11-12 MED ORDER — TETANUS-DIPHTH-ACELL PERTUSSIS 5-2.5-18.5 LF-MCG/0.5 IM SUSP
0.5000 mL | Freq: Once | INTRAMUSCULAR | Status: AC
  Administered 2017-11-12: 0.5 mL via INTRAMUSCULAR
  Filled 2017-11-12: qty 0.5

## 2017-11-12 MED ORDER — ENOXAPARIN SODIUM 40 MG/0.4ML ~~LOC~~ SOLN: 40.0000 mg | SUBCUTANEOUS | Status: DC

## 2017-11-12 MED ORDER — LORAZEPAM 1 MG PO TABS: 1.0000 mg | ORAL_TABLET | Freq: Four times a day (QID) | ORAL | Status: AC | PRN

## 2017-11-12 MED ORDER — VITAMIN B-1 100 MG PO TABS
100.0000 mg | ORAL_TABLET | Freq: Every day | ORAL | Status: DC
  Administered 2017-11-14 – 2017-11-16 (×3): 100 mg via ORAL
  Filled 2017-11-12 (×3): qty 1

## 2017-11-12 MED ORDER — LORAZEPAM 2 MG/ML IJ SOLN: 0.0000 mg | Freq: Two times a day (BID) | INTRAMUSCULAR | Status: DC

## 2017-11-12 MED ORDER — HYDROMORPHONE HCL 1 MG/ML IJ SOLN
0.5000 mg | INTRAMUSCULAR | Status: DC | PRN
  Administered 2017-11-13: 0.5 mg via INTRAVENOUS
  Filled 2017-11-12: qty 0.5

## 2017-11-12 MED ORDER — LORAZEPAM 2 MG/ML IJ SOLN: 1.0000 mg | Freq: Four times a day (QID) | INTRAMUSCULAR | Status: AC | PRN

## 2017-11-12 MED ORDER — FENTANYL CITRATE (PF) 100 MCG/2ML IJ SOLN
50.0000 ug | INTRAMUSCULAR | Status: DC | PRN
  Administered 2017-11-12 – 2017-11-13 (×2): 50 ug via INTRAVENOUS
  Filled 2017-11-12 (×3): qty 2

## 2017-11-12 MED ORDER — ONDANSETRON HCL 4 MG/2ML IJ SOLN: 4.0000 mg | Freq: Four times a day (QID) | INTRAMUSCULAR | Status: DC | PRN

## 2017-11-12 MED ORDER — SODIUM CHLORIDE 0.9 % IV BOLUS (SEPSIS)
500.0000 mL | Freq: Once | INTRAVENOUS | Status: AC
  Administered 2017-11-12: 500 mL via INTRAVENOUS

## 2017-11-12 MED ORDER — ONDANSETRON 4 MG PO TBDP: 4.0000 mg | ORAL_TABLET | Freq: Four times a day (QID) | ORAL | Status: DC | PRN

## 2017-11-12 MED ORDER — FOLIC ACID 1 MG PO TABS
1.0000 mg | ORAL_TABLET | Freq: Every day | ORAL | Status: DC
  Administered 2017-11-14 – 2017-11-16 (×3): 1 mg via ORAL
  Filled 2017-11-12 (×3): qty 1

## 2017-11-12 MED ORDER — ONDANSETRON HCL 4 MG/2ML IJ SOLN
4.0000 mg | Freq: Once | INTRAMUSCULAR | Status: AC
  Administered 2017-11-12: 4 mg via INTRAVENOUS
  Filled 2017-11-12: qty 2

## 2017-11-12 MED ORDER — THIAMINE HCL 100 MG/ML IJ SOLN
100.0000 mg | Freq: Every day | INTRAMUSCULAR | Status: DC
  Administered 2017-11-13: 100 mg via INTRAVENOUS
  Filled 2017-11-12 (×2): qty 2

## 2017-11-12 MED ORDER — IOPAMIDOL (ISOVUE-300) INJECTION 61%
INTRAVENOUS | Status: AC
  Administered 2017-11-12: 100 mL
  Filled 2017-11-12: qty 100

## 2017-11-12 MED ORDER — POTASSIUM CHLORIDE IN NACL 20-0.9 MEQ/L-% IV SOLN
INTRAVENOUS | Status: DC
  Administered 2017-11-13 – 2017-11-14 (×3): via INTRAVENOUS
  Filled 2017-11-12 (×4): qty 1000

## 2017-11-12 MED ORDER — LORAZEPAM 2 MG/ML IJ SOLN: 0.0000 mg | Freq: Four times a day (QID) | INTRAMUSCULAR | Status: AC

## 2017-11-12 MED ORDER — ADULT MULTIVITAMIN W/MINERALS CH
1.0000 | ORAL_TABLET | Freq: Every day | ORAL | Status: DC
  Administered 2017-11-14 – 2017-11-16 (×3): 1 via ORAL
  Filled 2017-11-12 (×3): qty 1

## 2017-11-12 NOTE — ED Notes (Signed)
Pt has removed all his equipment to measure vitals. Pt is standing at the side of the bed against RN's wishes.

## 2017-11-12 NOTE — Progress Notes (Signed)
Orthopedic Tech Progress Note Patient Details:  Carlos Kim 29-Dec-1990 [id number]  Ortho Devices Type of Ortho Device: Long leg splint, Stirrup splint Ortho Device/Splint Location: RLE Ortho Device/Splint Interventions: Ordered, Application   Post Interventions Patient Tolerated: Well Instructions Provided: Care of device   Jennye MoccasinHughes, [name] 11/12/2017, 10:58 PM
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
## 2017-11-12 NOTE — ED Notes (Addendum)
Tyrone, phlebotomist at the bedside to collect labs

## 2017-11-12 NOTE — ED Notes (Signed)
Patient transported to CT 
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
## 2017-11-12 NOTE — ED Notes (Signed)
Patient transported to MRI 

## 2017-11-12 NOTE — ED Notes (Signed)
Applied 25mm condom catheter with a bed bag and benzoin tincture used.
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
## 2017-11-13 ENCOUNTER — Other Ambulatory Visit: Payer: Self-pay

## 2017-11-13 ENCOUNTER — Inpatient Hospital Stay (HOSPITAL_COMMUNITY): Payer: 59 | Admitting: Certified Registered Nurse Anesthetist

## 2017-11-13 ENCOUNTER — Encounter (HOSPITAL_COMMUNITY): Admission: EM | Disposition: A | Discharge status: Home Health | Payer: Self-pay | Source: Home / Self Care

## 2017-11-13 ENCOUNTER — Inpatient Hospital Stay (HOSPITAL_COMMUNITY): Payer: 59

## 2017-11-13 DIAGNOSIS — T79A21A Traumatic compartment syndrome of right lower extremity, initial encounter: Secondary | ICD-10-CM

## 2017-11-13 DIAGNOSIS — S82251A Displaced comminuted fracture of shaft of right tibia, initial encounter for closed fracture: Secondary | ICD-10-CM

## 2017-11-13 DIAGNOSIS — S82871A Displaced pilon fracture of right tibia, initial encounter for closed fracture: Secondary | ICD-10-CM

## 2017-11-13 HISTORY — PX: ORIF ANKLE FRACTURE: SHX5408

## 2017-11-13 LAB — CBC
HCT: 41.4 % (ref 39.0–52.0)
HEMOGLOBIN: 14.1 g/dL (ref 13.0–17.0)
MCH: 31.1 pg (ref 26.0–34.0)
MCHC: 34.1 g/dL (ref 30.0–36.0)
MCV: 91.2 fL (ref 78.0–100.0)
Platelets: 215 10*3/uL (ref 150–400)
RBC: 4.54 MIL/uL (ref 4.22–5.81)
RDW: 13.4 % (ref 11.5–15.5)
WBC: 8.6 10*3/uL (ref 4.0–10.5)

## 2017-11-13 LAB — COMPREHENSIVE METABOLIC PANEL
ALBUMIN: 3.9 g/dL (ref 3.5–5.0)
ALK PHOS: 52 U/L (ref 38–126)
ALT: 82 U/L — ABNORMAL HIGH (ref 17–63)
ANION GAP: 9 (ref 5–15)
AST: 62 U/L — AB (ref 15–41)
BUN: 7 mg/dL (ref 6–20)
CALCIUM: 8.5 mg/dL — AB (ref 8.9–10.3)
CO2: 21 mmol/L — ABNORMAL LOW (ref 22–32)
Chloride: 106 mmol/L (ref 101–111)
Creatinine, Ser: 0.82 mg/dL (ref 0.61–1.24)
GFR calc Af Amer: >60 mL/min (ref >60)
GFR calc non Af Amer: >60 mL/min (ref >60)
GLUCOSE: 134 mg/dL — AB (ref 65–99)
Potassium: 4 mmol/L (ref 3.5–5.1)
SODIUM: 136 mmol/L (ref 135–145)
Total Bilirubin: 0.5 mg/dL (ref 0.3–1.2)
Total Protein: 6.6 g/dL (ref 6.5–8.1)

## 2017-11-13 LAB — MRSA PCR SCREENING: MRSA BY PCR: NEGATIVE

## 2017-11-13 LAB — SURGICAL PCR SCREEN
MRSA, PCR: NEGATIVE
Staphylococcus aureus: POSITIVE — AB

## 2017-11-13 SURGERY — OPEN REDUCTION INTERNAL FIXATION (ORIF) ANKLE FRACTURE
Anesthesia: General | Laterality: Right

## 2017-11-13 MED ORDER — DEXAMETHASONE SODIUM PHOSPHATE 10 MG/ML IJ SOLN
INTRAMUSCULAR | Status: DC | PRN
  Administered 2017-11-13: 10 mg via INTRAVENOUS

## 2017-11-13 MED ORDER — ROCURONIUM BROMIDE 10 MG/ML (PF) SYRINGE
PREFILLED_SYRINGE | INTRAVENOUS | Status: AC
  Filled 2017-11-13: qty 5

## 2017-11-13 MED ORDER — MUPIROCIN 2 % EX OINT
1.0000 "application " | TOPICAL_OINTMENT | Freq: Two times a day (BID) | CUTANEOUS | Status: DC
  Administered 2017-11-13 – 2017-11-16 (×6): 1 via NASAL
  Filled 2017-11-13 (×2): qty 22

## 2017-11-13 MED ORDER — ROCURONIUM BROMIDE 100 MG/10ML IV SOLN
INTRAVENOUS | Status: DC | PRN
  Administered 2017-11-13: 60 mg via INTRAVENOUS

## 2017-11-13 MED ORDER — METHOCARBAMOL 1000 MG/10ML IJ SOLN: 500.0000 mg | Freq: Four times a day (QID) | INTRAVENOUS | Status: DC | PRN

## 2017-11-13 MED ORDER — METHOCARBAMOL 500 MG PO TABS: 500.0000 mg | ORAL_TABLET | Freq: Four times a day (QID) | ORAL | Status: DC | PRN

## 2017-11-13 MED ORDER — SUGAMMADEX SODIUM 200 MG/2ML IV SOLN
INTRAVENOUS | Status: DC | PRN
  Administered 2017-11-13: 200 mg via INTRAVENOUS

## 2017-11-13 MED ORDER — MIDAZOLAM HCL 2 MG/2ML IJ SOLN
INTRAMUSCULAR | Status: AC
  Filled 2017-11-13: qty 2

## 2017-11-13 MED ORDER — KETOROLAC TROMETHAMINE 15 MG/ML IJ SOLN
15.0000 mg | Freq: Four times a day (QID) | INTRAMUSCULAR | Status: AC
  Administered 2017-11-13 – 2017-11-14 (×4): 15 mg via INTRAVENOUS
  Filled 2017-11-13 (×4): qty 1

## 2017-11-13 MED ORDER — SUGAMMADEX SODIUM 200 MG/2ML IV SOLN
INTRAVENOUS | Status: AC
  Filled 2017-11-13: qty 2

## 2017-11-13 MED ORDER — METOCLOPRAMIDE HCL 5 MG PO TABS: 5.0000 mg | ORAL_TABLET | Freq: Three times a day (TID) | ORAL | Status: DC | PRN

## 2017-11-13 MED ORDER — POLYETHYLENE GLYCOL 3350 17 G PO PACK: 17.0000 g | PACK | Freq: Every day | ORAL | Status: DC | PRN

## 2017-11-13 MED ORDER — FENTANYL CITRATE (PF) 250 MCG/5ML IJ SOLN
INTRAMUSCULAR | Status: AC
  Filled 2017-11-13: qty 5

## 2017-11-13 MED ORDER — PROPOFOL 10 MG/ML IV BOLUS
INTRAVENOUS | Status: DC | PRN
  Administered 2017-11-13: 200 mg via INTRAVENOUS

## 2017-11-13 MED ORDER — FENTANYL CITRATE (PF) 100 MCG/2ML IJ SOLN
INTRAMUSCULAR | Status: DC | PRN
  Administered 2017-11-13 (×2): 50 ug via INTRAVENOUS
  Administered 2017-11-13: 100 ug via INTRAVENOUS
  Administered 2017-11-13 (×2): 50 ug via INTRAVENOUS
  Administered 2017-11-13 (×2): 100 ug via INTRAVENOUS

## 2017-11-13 MED ORDER — METOCLOPRAMIDE HCL 5 MG/ML IJ SOLN: 5.0000 mg | Freq: Three times a day (TID) | INTRAMUSCULAR | Status: DC | PRN

## 2017-11-13 MED ORDER — ROPIVACAINE HCL 5 MG/ML IJ SOLN
INTRAMUSCULAR | Status: DC | PRN
  Administered 2017-11-13: 20 mL via PERINEURAL

## 2017-11-13 MED ORDER — PROPOFOL 10 MG/ML IV BOLUS
INTRAVENOUS | Status: AC
  Filled 2017-11-13: qty 20

## 2017-11-13 MED ORDER — ONDANSETRON HCL 4 MG/2ML IJ SOLN
INTRAMUSCULAR | Status: DC | PRN
  Administered 2017-11-13: 4 mg via INTRAVENOUS

## 2017-11-13 MED ORDER — ONDANSETRON HCL 4 MG/2ML IJ SOLN: 4.0000 mg | Freq: Four times a day (QID) | INTRAMUSCULAR | Status: DC | PRN

## 2017-11-13 MED ORDER — ACETAMINOPHEN 650 MG RE SUPP: 650.0000 mg | RECTAL | Status: DC | PRN

## 2017-11-13 MED ORDER — PROMETHAZINE HCL 25 MG/ML IJ SOLN: 6.2500 mg | INTRAMUSCULAR | Status: DC | PRN

## 2017-11-13 MED ORDER — SODIUM CHLORIDE 0.9 % IV SOLN: INTRAVENOUS | Status: DC

## 2017-11-13 MED ORDER — HYDROMORPHONE HCL 1 MG/ML IJ SOLN: 0.2500 mg | INTRAMUSCULAR | Status: DC | PRN

## 2017-11-13 MED ORDER — KETOROLAC TROMETHAMINE 30 MG/ML IJ SOLN: 30.0000 mg | Freq: Once | INTRAMUSCULAR | Status: DC | PRN

## 2017-11-13 MED ORDER — DOCUSATE SODIUM 100 MG PO CAPS
100.0000 mg | ORAL_CAPSULE | Freq: Two times a day (BID) | ORAL | Status: DC
  Administered 2017-11-13 – 2017-11-16 (×6): 100 mg via ORAL
  Filled 2017-11-13 (×6): qty 1

## 2017-11-13 MED ORDER — 0.9 % SODIUM CHLORIDE (POUR BTL) OPTIME
TOPICAL | Status: DC | PRN
  Administered 2017-11-13: 1000 mL

## 2017-11-13 MED ORDER — CHLORHEXIDINE GLUCONATE 4 % EX LIQD: 60.0000 mL | Freq: Once | CUTANEOUS | Status: DC

## 2017-11-13 MED ORDER — KETOROLAC TROMETHAMINE 15 MG/ML IJ SOLN
INTRAMUSCULAR | Status: AC
  Filled 2017-11-13: qty 1

## 2017-11-13 MED ORDER — CEFAZOLIN SODIUM-DEXTROSE 2-4 GM/100ML-% IV SOLN: 2.0000 g | INTRAVENOUS | Status: DC

## 2017-11-13 MED ORDER — CEFAZOLIN SODIUM-DEXTROSE 2-4 GM/100ML-% IV SOLN
2.0000 g | Freq: Four times a day (QID) | INTRAVENOUS | Status: AC
  Administered 2017-11-13 – 2017-11-14 (×3): 2 g via INTRAVENOUS
  Filled 2017-11-13 (×3): qty 100

## 2017-11-13 MED ORDER — CHLORHEXIDINE GLUCONATE CLOTH 2 % EX PADS
6.0000 | MEDICATED_PAD | Freq: Every day | CUTANEOUS | Status: DC
  Administered 2017-11-13 – 2017-11-15 (×3): 6 via TOPICAL

## 2017-11-13 MED ORDER — LIDOCAINE 2% (20 MG/ML) 5 ML SYRINGE
INTRAMUSCULAR | Status: AC
  Filled 2017-11-13: qty 5

## 2017-11-13 MED ORDER — ACETAMINOPHEN 325 MG PO TABS: 650.0000 mg | ORAL_TABLET | ORAL | Status: DC | PRN

## 2017-11-13 MED ORDER — DEXAMETHASONE SODIUM PHOSPHATE 10 MG/ML IJ SOLN
INTRAMUSCULAR | Status: AC
  Filled 2017-11-13: qty 1

## 2017-11-13 MED ORDER — MIDAZOLAM HCL 5 MG/5ML IJ SOLN
INTRAMUSCULAR | Status: DC | PRN
  Administered 2017-11-13: 2 mg via INTRAVENOUS

## 2017-11-13 MED ORDER — MAGNESIUM CITRATE PO SOLN: 1.0000 | Freq: Once | ORAL | Status: DC | PRN

## 2017-11-13 MED ORDER — LACTATED RINGERS IV SOLN
INTRAVENOUS | Status: DC | PRN
  Administered 2017-11-13: 12:00:00 via INTRAVENOUS

## 2017-11-13 MED ORDER — BISACODYL 10 MG RE SUPP: 10.0000 mg | Freq: Every day | RECTAL | Status: DC | PRN

## 2017-11-13 MED ORDER — CEFAZOLIN SODIUM-DEXTROSE 2-4 GM/100ML-% IV SOLN
INTRAVENOUS | Status: AC
  Filled 2017-11-13: qty 100

## 2017-11-13 MED ORDER — ONDANSETRON HCL 4 MG PO TABS: 4.0000 mg | ORAL_TABLET | Freq: Four times a day (QID) | ORAL | Status: DC | PRN

## 2017-11-13 MED ORDER — CEFAZOLIN SODIUM-DEXTROSE 2-3 GM-%(50ML) IV SOLR
INTRAVENOUS | Status: DC | PRN
  Administered 2017-11-13: 2 g via INTRAVENOUS

## 2017-11-13 MED ORDER — LIDOCAINE HCL (CARDIAC) 20 MG/ML IV SOLN
INTRAVENOUS | Status: DC | PRN
  Administered 2017-11-13: 60 mg via INTRAVENOUS

## 2017-11-13 MED ORDER — ONDANSETRON HCL 4 MG/2ML IJ SOLN
INTRAMUSCULAR | Status: AC
  Filled 2017-11-13: qty 2

## 2017-11-13 MED ORDER — LIDOCAINE-EPINEPHRINE (PF) 1.5 %-1:200000 IJ SOLN
INTRAMUSCULAR | Status: DC | PRN
  Administered 2017-11-13: 20 mL via PERINEURAL

## 2017-11-13 MED ORDER — POVIDONE-IODINE 10 % EX SWAB: 2.0000 "application " | Freq: Once | CUTANEOUS | Status: DC

## 2017-11-13 SURGICAL SUPPLY — 52 items
BANDAGE ESMARK 6X9 LF (GAUZE/BANDAGES/DRESSINGS) IMPLANT
BIT DRILL 2.5X110 QC LCP DISP (BIT) ×3 IMPLANT
BIT DRILL LCP QC 2X140 (BIT) ×3 IMPLANT
BNDG COHESIVE 4X5 TAN STRL (GAUZE/BANDAGES/DRESSINGS) ×3 IMPLANT
BNDG ESMARK 6X9 LF (GAUZE/BANDAGES/DRESSINGS)
BNDG GAUZE ELAST 4 BULKY (GAUZE/BANDAGES/DRESSINGS) ×3 IMPLANT
COVER SURGICAL LIGHT HANDLE (MISCELLANEOUS) ×6 IMPLANT
DRAPE INCISE IOBAN 66X45 STRL (DRAPES) ×6 IMPLANT
DRAPE OEC MINIVIEW 54X84 (DRAPES) ×3 IMPLANT
DRAPE U-SHAPE 47X51 STRL (DRAPES) ×3 IMPLANT
DRESSING PREVENA PLUS CUSTOM (GAUZE/BANDAGES/DRESSINGS) ×1 IMPLANT
DRSG ADAPTIC 3X8 NADH LF (GAUZE/BANDAGES/DRESSINGS) ×3 IMPLANT
DRSG PAD ABDOMINAL 8X10 ST (GAUZE/BANDAGES/DRESSINGS) ×3 IMPLANT
DRSG PREVENA PLUS CUSTOM (GAUZE/BANDAGES/DRESSINGS) ×3
DURAPREP 26ML APPLICATOR (WOUND CARE) ×3 IMPLANT
ELECT REM PT RETURN 9FT ADLT (ELECTROSURGICAL) ×3
ELECTRODE REM PT RTRN 9FT ADLT (ELECTROSURGICAL) ×1 IMPLANT
GAUZE SPONGE 4X4 12PLY STRL (GAUZE/BANDAGES/DRESSINGS) ×3 IMPLANT
GLOVE BIOGEL PI IND STRL 9 (GLOVE) ×1 IMPLANT
GLOVE BIOGEL PI INDICATOR 9 (GLOVE) ×2
GLOVE SURG ORTHO 9.0 STRL STRW (GLOVE) ×3 IMPLANT
GOWN STRL REUS W/ TWL XL LVL3 (GOWN DISPOSABLE) ×3 IMPLANT
GOWN STRL REUS W/TWL XL LVL3 (GOWN DISPOSABLE) ×6
K-WIRE 1.25 TRCR POINT 150 (WIRE) ×3
KIT BASIN OR (CUSTOM PROCEDURE TRAY) ×3 IMPLANT
KIT ROOM TURNOVER OR (KITS) ×3 IMPLANT
KWIRE 1.25 TRCR POINT 150 (WIRE) ×1 IMPLANT
MANIFOLD NEPTUNE II (INSTRUMENTS) IMPLANT
NS IRRIG 1000ML POUR BTL (IV SOLUTION) ×3 IMPLANT
PACK ORTHO EXTREMITY (CUSTOM PROCEDURE TRAY) ×3 IMPLANT
PAD ARMBOARD 7.5X6 YLW CONV (MISCELLANEOUS) ×6 IMPLANT
PLATE DIST TIB 14H 232 RIGHT (Plate) ×3 IMPLANT
SCREW CORT BONE 3.5X10 LP (Screw) ×3 IMPLANT
SCREW CORTEX LOW PRO 3.5X30 (Screw) ×3 IMPLANT
SCREW CORTEX LOW PRO 3.5X32 (Screw) ×3 IMPLANT
SCREW CORTEX LP 3.5X34MM (Screw) ×3 IMPLANT
SCREW LOCKING 2.7X44MM VA (Screw) ×3 IMPLANT
SCREW LOCKING VA 2.7X40MM (Screw) ×6 IMPLANT
SCREW LOCKING VA 2.7X42 (Screw) ×3 IMPLANT
SPONGE LAP 18X18 X RAY DECT (DISPOSABLE) ×3 IMPLANT
STAPLER VISISTAT 35W (STAPLE) IMPLANT
SUCTION FRAZIER HANDLE 10FR (MISCELLANEOUS) ×2
SUCTION TUBE FRAZIER 10FR DISP (MISCELLANEOUS) ×1 IMPLANT
SUT ETHILON 2 0 PSLX (SUTURE) ×9 IMPLANT
SUT VIC AB 2-0 CT1 27 (SUTURE) ×2
SUT VIC AB 2-0 CT1 TAPERPNT 27 (SUTURE) ×1 IMPLANT
TOWEL OR 17X24 6PK STRL BLUE (TOWEL DISPOSABLE) ×3 IMPLANT
TOWEL OR 17X26 10 PK STRL BLUE (TOWEL DISPOSABLE) ×3 IMPLANT
TUBE CONNECTING 12'X1/4 (SUCTIONS) ×1
TUBE CONNECTING 12X1/4 (SUCTIONS) ×2 IMPLANT
WND VAC CANISTER 500ML (MISCELLANEOUS) ×3 IMPLANT
YANKAUER SUCT BULB TIP NO VENT (SUCTIONS) ×3 IMPLANT

## 2017-11-13 NOTE — Transfer of Care (Signed)
Immediate Anesthesia Transfer of Care Note  Patient: Carlos Kim  Procedure(s) Performed: OPEN REDUCTION INTERNAL FIXATION (ORIF) PILON ANKLE FRACTURE (Right )  Patient Location: PACU  Anesthesia Type:General  Level of Consciousness: awake, alert , oriented and patient cooperative  Airway & Oxygen Therapy: Patient Spontanous Breathing and Patient connected to nasal cannula oxygen  Post-op Assessment: Report given to RN and Post -op Vital signs reviewed and stable  Post vital signs: Reviewed and stable  Last Vitals:  Vitals:   11/13/17 1000 11/13/17 1149  BP: (!) 147/76   Pulse: 87   Resp: 18   Temp:  37.3 C  SpO2: 93%     Last Pain:  Vitals:   11/13/17 1149  TempSrc: Oral  PainSc:          Complications: No apparent anesthesia complications
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
## 2017-11-13 NOTE — Anesthesia Postprocedure Evaluation (Signed)
Anesthesia Post Note  Patient: Carlos Kim E Savino  Procedure(s) Performed: OPEN REDUCTION INTERNAL FIXATION (ORIF) PILON ANKLE FRACTURE (Right )     Patient location during evaluation: PACU Anesthesia Type: General Level of consciousness: awake and alert Pain management: pain level controlled Vital Signs Assessment: post-procedure vital signs reviewed and stable Respiratory status: spontaneous breathing, nonlabored ventilation, respiratory function stable and patient connected to nasal cannula oxygen Cardiovascular status: blood pressure returned to baseline and stable Postop Assessment: no apparent nausea or vomiting Anesthetic complications: no    Last Vitals:  Vitals:   11/13/17 1830 11/13/17 1845  BP: 126/75 123/73  Pulse: 68 70  Resp: 12 11  Temp:    SpO2: 96% 96%    Last Pain:  Vitals:   11/13/17 1501  TempSrc:   PainSc: Asleep                 Yamato Kopf S

## 2017-11-13 NOTE — Anesthesia Procedure Notes (Addendum)
Anesthesia Regional Block: Popliteal block   Pre-Anesthetic Checklist: ,, timeout performed, Correct Patient, Correct Site, Correct Laterality, Correct Procedure, Correct Position, site marked, Risks and benefits discussed,  Surgical consent,  Pre-op evaluation,  At surgeon's request and post-op pain management  Laterality: Right  Prep: chloraprep       Needles:  Injection technique: Single-shot  Needle Type: Echogenic Needle     Needle Length: 9cm      Additional Needles:   Procedures:,,,, ultrasound used (permanent image in chart),,,,  Narrative:  Start time: 11/13/2017 2:20 PM End time: 11/13/2017 2:30 PM Injection made incrementally with aspirations every 5 mL.  Performed by: Personally  Anesthesiologist: Eilene Ghaziose, Enolia Koepke, MD  Additional Notes: Patient tolerated the procedure well without complications  No picture available

## 2017-11-13 NOTE — Anesthesia Procedure Notes (Signed)
Procedure Name: Intubation Date/Time: 11/13/2017 12:30 PM Performed by: Lowella Dell, CRNA Pre-anesthesia Checklist: Patient identified, Emergency Drugs available, Suction available and Patient being monitored Patient Re-evaluated:Patient Re-evaluated prior to induction Oxygen Delivery Method: Circle System Utilized Preoxygenation: Pre-oxygenation with 100% oxygen Induction Type: IV induction Ventilation: Mask ventilation without difficulty and Oral airway inserted - appropriate to patient size Laryngoscope Size: Mac and 4 Grade View: Grade I Tube type: Oral Tube size: 7.5 mm Number of attempts: 1 Airway Equipment and Method: Stylet and Oral airway Placement Confirmation: ETT inserted through vocal cords under direct vision,  positive ETCO2 and breath sounds checked- equal and bilateral Tube secured with: Tape Dental Injury: Teeth and Oropharynx as per pre-operative assessment

## 2017-11-13 NOTE — Anesthesia Preprocedure Evaluation (Addendum)
Anesthesia Evaluation  Patient identified by MRN, date of birth, ID band Patient awake  General Assessment Comment:BAC  0.252 on admission 3x legal limit  Reviewed: Allergy & Precautions, NPO status , Patient's Chart, lab work & pertinent test results  Airway Mallampati: II  TM Distance: >3 FB Neck ROM: Full    Dental no notable dental hx. (+) Dental Advisory Given   Pulmonary neg pulmonary ROS,    Pulmonary exam normal breath sounds clear to auscultation       Cardiovascular negative cardio ROS Normal cardiovascular exam Rhythm:Regular Rate:Normal     Neuro/Psych negative neurological ROS  negative psych ROS   GI/Hepatic negative GI ROS, (+)     substance abuse  alcohol use,   Endo/Other  negative endocrine ROS  Renal/GU negative Renal ROS  negative genitourinary   Musculoskeletal negative musculoskeletal ROS (+)   Abdominal   Peds negative pediatric ROS (+)  Hematology negative hematology ROS (+)   Anesthesia Other Findings   Reproductive/Obstetrics negative OB ROS                           Anesthesia Physical Anesthesia Plan  ASA: II and emergent  Anesthesia Plan: General   Post-op Pain Management:    Induction: Intravenous  PONV Risk Score and Plan: 2  Airway Management Planned: Oral ETT  Additional Equipment:   Intra-op Plan:   Post-operative Plan: Extubation in OR  Informed Consent: I have reviewed the patients History and Physical, chart, labs and discussed the procedure including the risks, benefits and alternatives for the proposed anesthesia with the patient or authorized representative who has indicated his/her understanding and acceptance.   Dental advisory given  Plan Discussed with: CRNA and Surgeon  Anesthesia Plan Comments:         Anesthesia Quick Evaluation

## 2017-11-13 NOTE — Consult Note (Signed)
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   ISOVUE-300 IOPAMIDOL (ISOVUE-300) INJECTION 61% COMPARISON:  None. FINDINGS: CT CHEST FINDINGS Cardiovascular: The heart is normal in size. The thoracic aorta is unremarkable. There is no evidence of aortic injury. There is no evidence of venous hemorrhage. The great vessels are unremarkable in appearance. Mediastinum/Nodes: The mediastinum is unremarkable appearance. No mediastinal lymphadenopathy is seen. No pericardial effusion is identified. The visualized portions of the thyroid gland are unremarkable. No axillary lymphadenopathy is appreciated. Lungs/Pleura: Minimal bilateral subsegmental atelectasis is noted. The lungs are otherwise clear. No definite pulmonary parenchymal contusion is seen. No pleural effusion or pneumothorax is identified. No masses are seen. Musculoskeletal: No acute osseous abnormalities are identified. The visualized musculature is unremarkable in appearance. Mild soft tissue injury is noted along the upper medial left and lower right anterior chest wall. CT ABDOMEN PELVIS FINDINGS Hepatobiliary: Diffuse fatty infiltration is noted within the liver, with mild sparing about the gallbladder fossa. The gallbladder is unremarkable in appearance. The common bile duct remains normal in caliber. Pancreas: The pancreas is within normal  limits. Spleen: The spleen is unremarkable in appearance. Adrenals/Urinary Tract: The adrenal glands are unremarkable in appearance. The kidneys are within normal limits. There is no evidence of hydronephrosis. No renal or ureteral stones are identified. No perinephric stranding is seen. Stomach/Bowel: The stomach is unremarkable in appearance. The small bowel is within normal limits. The appendix is normal in caliber, without evidence of appendicitis. The colon is unremarkable in appearance. Mild mesenteric stranding and nodularity at the lower abdomen could reflect mild injury and trace hemorrhage. The surrounding bowel is unremarkable in appearance. Vascular/Lymphatic: The abdominal aorta is unremarkable in appearance. The inferior vena cava is grossly unremarkable. No retroperitoneal lymphadenopathy is seen. No pelvic sidewall lymphadenopathy is identified. Reproductive: The bladder is mildly distended and grossly unremarkable. The prostate remains normal in size. Other: A small amount of hemorrhage is noted within the pelvis, possibly arising from the mesenteric injury. Musculoskeletal: No acute osseous abnormalities are identified. The visualized musculature is unremarkable in appearance. Mild soft tissue injury is noted at the left inguinal region, tracking laterally along the left flank. CT THORACIC SPINE Alignment:  Normal. Vertebrae: No evidence of fracture. The vertebral bodies appear intact. Paraspinal and other soft tissues: The paraspinal musculature is unremarkable in appearance. No surrounding soft tissue injury is seen. Disc levels: Intervertebral disc spaces are preserved. The bony foramina are grossly unremarkable. CT LUMBAR SPINE Segmentation:  5 lumbar vertebral bodies identified. Alignment:  Normal. Vertebrae: No evidence of fracture. The vertebral bodies appear intact. Paraspinal and other soft tissues: The paraspinal musculature is unremarkable in appearance. No surrounding soft tissue injury  is seen. Disc levels: Intervertebral disc spaces are preserved. The bony foramina are grossly unremarkable. IMPRESSION: 1. Small amount of acute hemorrhage within the pelvis, possibly arising from the mesenteric injury described below. 2. Mild mesenteric stranding and nodularity at the lower abdomen could reflect mild injury and trace mesenteric hemorrhage. Surrounding bowel is unremarkable in appearance. 3. No evidence of fracture or subluxation along the thoracic or lumbar spine. 4. Mild soft tissue injury at the left inguinal region, tracking laterally along the left flank. 5. Mild soft tissue injury along the upper medial left and lower right anterior chest wall. These results were called by telephone at the time of interpretation on 11/12/2017 at 9:55 pm to Dr. Arthor Captain, who verbally acknowledged these results. Electronically Signed   By: Roanna Raider M.D.   On: 11/12/2017 21:57   - pertinent xrays, CT, MRI studies were reviewed  and independently interpreted  Positive ROS: All other systems have been reviewed and were otherwise negative with the exception of those mentioned in the HPI and as above.  Physical Exam: General: Alert, no acute distress Psychiatric: Patient is competent for consent with normal mood and affect Lymphatic: No axillary or cervical lymphadenopathy Cardiovascular: No pedal edema Respiratory: No cyanosis, no use of accessory musculature GI: No organomegaly, abdomen is soft and non-tender  Skin: Examination patient's right leg is closed no open fractures.   Neurologic: Patient does  have protective sensation bilateral lower extremities.   MUSCULOSKELETAL:  Examination of the right lower extremity patient can actively and passively wiggle his toes no signs of compartment syndrome.  Patient has a good dorsalis pedis pulse.  Radiographs show a nondisplaced spiral tibial fracture.  There was some slight irregularity at the ankle and a CT scan was ordered and this  shows a comminuted pilon fracture of the right ankle.  Assessment: Assessment: Spiral right tibia fracture with comminuted pilon fracture of the right ankle.  Plan: Plan: We will plan for an anterior lateral plate for open reduction internal fixation.  Risks and benefits were discussed including infection neurovascular injury DVT pulmonary embolus compartment syndrome arthritis of the ankle need for additional surgery.  Patient states he understands wishes to proceed at this time.  Thank you for the consult and the opportunity to see Mr. Jarome Trull, MD [facility] [phone] 10:11 AM

## 2017-11-13 NOTE — Progress Notes (Signed)
Orthopedic Tech Progress Note Patient Details:  Carlos Kim Apr 03, 1991 [id number]  Ortho Devices Type of Ortho Device: Prafo boot/shoe(Right,  applied prafo boot to pt right foot/leg.  pt tolerated well.  /NOTE:  Cam boot was placed at bedside for future use/ambulation.) Ortho Device/Splint Location: Right,  applied prafo boot to pt right foot/leg.  pt tolerated well.  /NOTE:  Cam boot was placed at bedside for future use/ambulation. Ortho Device/Splint Interventions: Application   Post Interventions Patient Tolerated: Well Instructions Provided: Care of device   Alvina ChouWilliams, [name] 11/13/2017, 3:23 PM

## 2017-11-14 LAB — BASIC METABOLIC PANEL
Anion gap: 4 — ABNORMAL LOW (ref 5–15)
BUN: 17 mg/dL (ref 6–20)
CALCIUM: 7.9 mg/dL — AB (ref 8.9–10.3)
CO2: 23 mmol/L (ref 22–32)
CREATININE: 0.88 mg/dL (ref 0.61–1.24)
Chloride: 108 mmol/L (ref 101–111)
GFR calc non Af Amer: >60 mL/min (ref >60)
Glucose, Bld: 130 mg/dL — ABNORMAL HIGH (ref 65–99)
Potassium: 4.4 mmol/L (ref 3.5–5.1)
Sodium: 135 mmol/L (ref 135–145)

## 2017-11-14 LAB — CBC
HCT: 33.6 % — ABNORMAL LOW (ref 39.0–52.0)
Hemoglobin: 11.2 g/dL — ABNORMAL LOW (ref 13.0–17.0)
MCH: 31.2 pg (ref 26.0–34.0)
MCHC: 33.3 g/dL (ref 30.0–36.0)
MCV: 93.6 fL (ref 78.0–100.0)
Platelets: 192 10*3/uL (ref 150–400)
RBC: 3.59 MIL/uL — AB (ref 4.22–5.81)
RDW: 13.5 % (ref 11.5–15.5)
WBC: 7.7 10*3/uL (ref 4.0–10.5)

## 2017-11-14 LAB — HIV ANTIBODY (ROUTINE TESTING W REFLEX): HIV SCREEN 4TH GENERATION: NONREACTIVE

## 2017-11-14 MED ORDER — OXYCODONE HCL 5 MG PO TABS
5.0000 mg | ORAL_TABLET | ORAL | Status: DC | PRN
  Administered 2017-11-14 – 2017-11-15 (×2): 10 mg via ORAL
  Filled 2017-11-14 (×2): qty 2

## 2017-11-14 MED ORDER — ACETAMINOPHEN 500 MG PO TABS
1000.0000 mg | ORAL_TABLET | Freq: Three times a day (TID) | ORAL | Status: DC
  Administered 2017-11-14 – 2017-11-16 (×5): 1000 mg via ORAL
  Filled 2017-11-14 (×5): qty 2

## 2017-11-14 MED ORDER — HYDROMORPHONE HCL 1 MG/ML IJ SOLN: 0.5000 mg | INTRAMUSCULAR | Status: DC | PRN

## 2017-11-14 MED ORDER — METHOCARBAMOL 500 MG PO TABS
500.0000 mg | ORAL_TABLET | Freq: Three times a day (TID) | ORAL | Status: DC
  Administered 2017-11-14 – 2017-11-16 (×7): 500 mg via ORAL
  Filled 2017-11-14 (×7): qty 1

## 2017-11-14 MED ORDER — OXYCODONE HCL 5 MG PO TABS
5.0000 mg | ORAL_TABLET | ORAL | Status: DC | PRN
  Administered 2017-11-14: 10 mg via ORAL
  Filled 2017-11-14 (×2): qty 2

## 2017-11-14 MED ORDER — HYDROMORPHONE HCL 1 MG/ML IJ SOLN
0.5000 mg | INTRAMUSCULAR | Status: DC | PRN
  Filled 2017-11-14: qty 1

## 2017-11-14 NOTE — Progress Notes (Signed)
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 medial left and lower right anterior chest wall. CT ABDOMEN PELVIS FINDINGS Hepatobiliary: Diffuse fatty infiltration is noted within the liver, with mild sparing about  the gallbladder fossa. The gallbladder is unremarkable in appearance. The common bile duct remains normal in caliber. Pancreas: The pancreas is within normal limits. Spleen: The spleen is unremarkable in appearance. Adrenals/Urinary Tract: The adrenal glands are unremarkable in appearance. The kidneys are within normal limits. There is no evidence of hydronephrosis. No renal or ureteral stones are identified. No perinephric stranding is seen. Stomach/Bowel: The stomach is unremarkable in appearance. The small bowel is within normal limits. The appendix is normal in caliber, without evidence of appendicitis. The colon is unremarkable in appearance. Mild mesenteric stranding and nodularity at the lower abdomen could reflect mild injury and trace hemorrhage. The surrounding bowel is unremarkable in appearance. Vascular/Lymphatic: The abdominal aorta is unremarkable in appearance. The inferior vena cava is grossly unremarkable. No retroperitoneal lymphadenopathy is seen. No pelvic sidewall lymphadenopathy is identified. Reproductive: The bladder is mildly  distended and grossly unremarkable. The prostate remains normal in size. Other: A small amount of hemorrhage is noted within the pelvis, possibly arising from the mesenteric injury. Musculoskeletal: No acute osseous abnormalities are identified. The visualized musculature is unremarkable in appearance. Mild soft tissue injury is noted at the left inguinal region, tracking laterally along the left flank. CT THORACIC SPINE Alignment:  Normal. Vertebrae: No evidence of fracture. The vertebral bodies appear intact. Paraspinal and other soft tissues: The paraspinal musculature is unremarkable in appearance. No surrounding soft tissue injury is seen. Disc levels: Intervertebral disc spaces are preserved. The bony foramina are grossly unremarkable. CT LUMBAR SPINE Segmentation:  5 lumbar vertebral bodies identified. Alignment:  Normal. Vertebrae: No evidence of fracture. The vertebral bodies appear intact. Paraspinal and other soft tissues: The paraspinal musculature is unremarkable in appearance. No surrounding soft tissue injury is seen. Disc levels: Intervertebral disc spaces are preserved. The bony foramina are grossly unremarkable. IMPRESSION: 1. Small amount of acute hemorrhage within the pelvis, possibly arising from the mesenteric injury described below. 2. Mild mesenteric stranding and nodularity at the lower abdomen could reflect mild injury and trace mesenteric hemorrhage. Surrounding bowel is unremarkable in appearance. 3. No evidence of fracture or subluxation along the thoracic or lumbar spine. 4. Mild soft tissue injury at the left inguinal region, tracking laterally along the left flank. 5. Mild soft tissue injury along the upper medial left and lower right anterior chest wall. These results were called by telephone at the time of interpretation on 11/12/2017 at 9:55 pm to Dr. Arthor Captain, who verbally acknowledged these results. Electronically Signed   By: Roanna Raider M.D.   On: 11/12/2017 21:57     Anti-infectives: Anti-infectives (From admission, onward)   Start     Dose/Rate Route Frequency Ordered Stop   11/14/17 0600  ceFAZolin (ANCEF) IVPB 2g/100 mL premix  Status:  Discontinued     2 g 200 mL/hr over 30 Minutes Intravenous On call to O.R. 11/13/17 1538 11/13/17 1618   11/13/17 1700  ceFAZolin (ANCEF) IVPB 2g/100 mL premix     2 g 200 mL/hr over 30 Minutes Intravenous Every 6 hours 11/13/17 1538 11/14/17 0625   11/13/17 1216  ceFAZolin (ANCEF) 2-4 GM/100ML-% IVPB    Comments:  Roney Mans   : cabinet override      11/13/17 1216 11/13/17 1541      Assessment/Plan: s/p Procedure(s): OPEN REDUCTION INTERNAL FIXATION (ORIF) PILON ANKLE FRACTURE Advance diet Transfer to 5N  LOS: 2 days   Marta Lamas. Gae Bon, MD, FACS [phone] Trauma Surgeon 11/14/2017
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
## 2017-11-14 NOTE — Progress Notes (Signed)
Patient ID: Carlos Kim, male   DOB: 19-Aug-1991, 27 y.o.   MRN: [id number] Patient is postoperative day 1 status post open reduction internal fixation pilon fracture right ankle and internal fixation for spiral fracture of the tibia with fasciotomies and placement of wound VAC.  Anticipate the wound VAC will be in place for about 5 days.  Patient is to be nonweightbearing on the right lower extremity.  Orders are written for a Cam walker.  Nonweightbearing.  Patient moves his toes well and states he has good sensation.

## 2017-11-14 NOTE — Progress Notes (Signed)
Orthopedic Tech Progress Note Patient Details:  Carlos Kim [birth year] [id number] Patient has cam walker on. Patient ID: Carlos Kim, male   DOB: [birth year], 27 y.o.   MRN: [id number]   Jennye MoccasinHughes, Cira Deyoe Craig 11/14/2017, 3:30 PM

## 2017-11-15 ENCOUNTER — Encounter (HOSPITAL_COMMUNITY): Payer: Self-pay | Admitting: Orthopedic Surgery

## 2017-11-15 MED ORDER — ENOXAPARIN SODIUM 40 MG/0.4ML ~~LOC~~ SOLN
40.0000 mg | SUBCUTANEOUS | Status: DC
  Administered 2017-11-15: 40 mg via SUBCUTANEOUS
  Filled 2017-11-15: qty 0.4

## 2017-11-15 MED ORDER — HYDROMORPHONE HCL 1 MG/ML IJ SOLN: 0.5000 mg | INTRAMUSCULAR | Status: DC | PRN

## 2017-11-15 MED ORDER — OXYCODONE HCL 5 MG PO TABS
5.0000 mg | ORAL_TABLET | ORAL | Status: DC | PRN
  Administered 2017-11-15: 10 mg via ORAL
  Filled 2017-11-15: qty 2
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
## 2017-11-15 NOTE — Progress Notes (Signed)
Patient ID: Lorelee MarketDelfino E Kim, male   DOB: [birth year], 27 y.o.   MRN: [id number] Patient is postoperative day 2 internal fixation for a pilon fracture right ankle.  Patient has approximately 300 cc in the wound VAC.  He can wiggle his toes no signs of compartment syndrome he has been able to hop with therapy.  Anticipate patient being able to be discharged to home.  Continue therapy today.

## 2017-11-16 MED ORDER — OXYCODONE HCL 5 MG PO TABS: 5.0000 mg | ORAL_TABLET | ORAL | 0 refills | Status: DC | PRN

## 2017-11-16 MED ORDER — METHOCARBAMOL 500 MG PO TABS: 500.0000 mg | ORAL_TABLET | Freq: Three times a day (TID) | ORAL | 0 refills | Status: DC

## 2017-11-16 MED ORDER — ACETAMINOPHEN 500 MG PO TABS: 1000.0000 mg | ORAL_TABLET | Freq: Three times a day (TID) | ORAL | 0 refills | Status: DC
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
## 2017-11-16 NOTE — Discharge Instructions (Signed)
`Negative Pressure Wound Therapy Dressing Care °Negative pressure wound therapy (NPWT) is a device that helps wounds heal. NPWT helps the wound stay clean and healthy while it heals from the inside. °NPWT uses a bandage (dressing) that is made of a sponge or gauze-like material. The dressing is placed in or inside the wound. The wound is then covered and sealed with a cover dressing that sticks to your skin (adhesive). This keeps air out. A tube connects the cover dressing to a small pump. The pump sucks fluid and germs from the wound. The pump also controls any odor coming from the wound. °What are the risks? °NPWT is usually safe to use. The most common problem is skin irritation from the dressing adhesive, but there are many ways to prevent this from happening. However, more serious problems can develop, such as: °· Bleeding. °· Infection. °· Dehydration. °· Pain. ° °How to change your dressing °How often you change your dressing depends on your wound. If the pump is off for more than two hours, the dressing will need to be changed. Follow your health care provider’s instructions on how often to change it. Your health care provider may change your dressing, or a family member, friend, or caregiver may be shown how to change the dressing. It is important to: °· Wear gloves and protective clothing while changing a dressing. This may include eye protection. °· Never let anyone change your dressing if he or she has an infection, skin condition, or skin wound or cut of any size. ° °Preparing to change your dressing °· If needed, take pain medicine 30 minutes before the dressing change as prescribed by your health care provider. °· Set up a clean station for wound care. You will need: °? A disposable garbage bag that is open and ready to use. °? Hand sanitizer. °? Wound cleanser or saltwater solution (saline) as told by your health care provider. °? New dressing material or bandages. Make sure to open the dressing  package so that the dressing remains on the inside of the package. °You may also need the following in your clean station: °· A box of vinyl gloves. °· Tape. °· Skin protectant. This may be a wipe, film, or spray. °· Clean or germ-free (sterile) scissors. °· Wound liner. °· Cotton tip applicators. ° °Removing your old dressing °· Wash your hands with soap and water. Dry your hands with a clean towel. If soap and water are not available, use hand sanitizer. °· Put on gloves. °· Turn off the pump and disconnect the tubing from the dressing. °· Carefully remove the adhesive cover dressing in the direction of your hair growth. Only touch the outside edges of the dressing. °· Remove the dressing that is inside the wound. If the dressing sticks, use a wound cleanser or saline solution to wet the dressing. This helps it come off more easily. °· Throw the old dressing supplies into the ready garbage bag. °· Remove your gloves by grabbing the cuff and turning the glove inside out. Place the gloves in the trash immediately. °· Wash your hands with soap and water. Dry your hands with a clean towel. If soap and water are not available, use hand sanitizer. °Cleaning your wound °· Follow your health care provider's instructions on how to clean your wound. This may include using a saline or recommended wound cleanser. °· Do not use over-the-counter medicated or antiseptic creams, sprays, liquids, or dressings unless told to do so by your health care   provider. °· Clean the area thoroughly with the recommended saline solution or wound cleanser and a clean gauze pad. °· Throw the gauze pad into the garbage bag. °· Wash your hands with soap and water. Dry your hands with a clean towel. If soap and water are not available, use hand sanitizer. °Applying the dressing °· Apply a skin protectant to any skin that will be exposed to adhesive. Let the skin protectant dry. °· Put a new dressing into the wound. °· Apply a new cover dressing and  tube. °· Take off your gloves. Put them in the plastic bag with the old dressing. Tie the bag shut and throw it away. °· Wash your hands with soap and water. Dry your hands with a clean towel. If soap and water are not available, use hand sanitizer. °· Attach the suction and turn the pump back on. Do not change the settings on the machine without talking to a health care provider. °· Replace the container in the pump that collects fluid if it is full. Do this at least once a week. °Contact a health care provider if: °· You have new pain. °· You develop irritation, a rash, or itching around the wound or dressing. °· You see new black or yellow tissue in your wound. °· The dressing changes are painful or cause bleeding. °· The pump has been off for more than two hours and you do not know how to change the dressing. °· The alarm for the pump goes off and you do not know what to do. °Get help right away if: °· You have a lot of bleeding. °· You see a sudden change in the color or texture of the drainage. °· The wound breaks open. °· You have severe pain. °· You have signs of infection, such as: °? More redness, swelling, or pain. °? More fluid or blood. °? Warmth. °? Pus or a bad smell. °? Red streaks leading from wound. °? A fever. °This information is not intended to replace advice given to you by your health care provider. Make sure you discuss any questions you have with your health care provider. °Document Released: 01/22/2012 Document Revised: 11/25/2015 Document Reviewed: 08/05/2015 °Elsevier Interactive Patient Education © 2018 [employer] ° °
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
## 2017-11-16 NOTE — Progress Notes (Signed)
Patient ID: Carlos Kim, male   DOB: [birth year], 27 y.o.   MRN: [id number] Plan for discharge to home today.  Orders are written for a Praveena plus wound VAC pump to be obtained from the operating room for discharge.  Patient is to be nonweightbearing on the right, orders are written for this, and I will follow-up in the office next week.
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
## 2017-11-19 ENCOUNTER — Ambulatory Visit (INDEPENDENT_AMBULATORY_CARE_PROVIDER_SITE_OTHER): Payer: 59 | Admitting: Orthopedic Surgery

## 2017-11-19 ENCOUNTER — Other Ambulatory Visit (INDEPENDENT_AMBULATORY_CARE_PROVIDER_SITE_OTHER): Payer: Self-pay | Admitting: Family

## 2017-11-19 ENCOUNTER — Telehealth (INDEPENDENT_AMBULATORY_CARE_PROVIDER_SITE_OTHER): Payer: Self-pay | Admitting: Orthopedic Surgery

## 2017-11-19 ENCOUNTER — Encounter (INDEPENDENT_AMBULATORY_CARE_PROVIDER_SITE_OTHER): Payer: Self-pay | Admitting: Orthopedic Surgery

## 2017-11-19 DIAGNOSIS — S82871D Displaced pilon fracture of right tibia, subsequent encounter for closed fracture with routine healing: Secondary | ICD-10-CM

## 2017-11-19 MED ORDER — OXYCODONE-ACETAMINOPHEN 5-325 MG PO TABS: 1.0000 | ORAL_TABLET | ORAL | 0 refills | Status: DC | PRN

## 2017-11-19 NOTE — Telephone Encounter (Signed)
[facility] from [facility] called asking for verbal approval of 1 week 3 for basic home health care. CB # [phone]
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
## 2017-11-19 NOTE — Telephone Encounter (Signed)
Patient called asking for a refill on his oxycodone. CB # [phone]

## 2017-11-20 ENCOUNTER — Telehealth (INDEPENDENT_AMBULATORY_CARE_PROVIDER_SITE_OTHER): Payer: Self-pay | Admitting: Orthopedic Surgery

## 2017-11-20 NOTE — Telephone Encounter (Signed)
I called and spoke with patient to advise rx at front desk for pick up. 

## 2017-11-20 NOTE — Telephone Encounter (Signed)
Patient gave one time verbal authorization for his sister (April Yorke) to pick up Rx. I informed patient that he will need to update his DPR

## 2017-11-20 NOTE — Telephone Encounter (Signed)
I called and gave verbal ok for home health care.

## 2017-11-27 ENCOUNTER — Ambulatory Visit (INDEPENDENT_AMBULATORY_CARE_PROVIDER_SITE_OTHER): Payer: 59 | Admitting: Orthopedic Surgery

## 2017-11-27 ENCOUNTER — Encounter (INDEPENDENT_AMBULATORY_CARE_PROVIDER_SITE_OTHER): Payer: Self-pay | Admitting: Orthopedic Surgery

## 2017-11-27 ENCOUNTER — Ambulatory Visit (INDEPENDENT_AMBULATORY_CARE_PROVIDER_SITE_OTHER): Payer: 59

## 2017-11-27 DIAGNOSIS — S82871D Displaced pilon fracture of right tibia, subsequent encounter for closed fracture with routine healing: Secondary | ICD-10-CM

## 2017-11-27 MED ORDER — SILVER SULFADIAZINE 1 % EX CREA: 1.0000 "application " | TOPICAL_CREAM | Freq: Every day | CUTANEOUS | 3 refills | Status: DC

## 2017-11-27 MED ORDER — OXYCODONE-ACETAMINOPHEN 5-325 MG PO TABS: 1.0000 | ORAL_TABLET | Freq: Three times a day (TID) | ORAL | 0 refills | Status: DC | PRN
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
## 2017-12-11 ENCOUNTER — Ambulatory Visit (INDEPENDENT_AMBULATORY_CARE_PROVIDER_SITE_OTHER): Payer: 59

## 2017-12-11 ENCOUNTER — Encounter (INDEPENDENT_AMBULATORY_CARE_PROVIDER_SITE_OTHER): Payer: Self-pay | Admitting: Orthopedic Surgery

## 2017-12-11 ENCOUNTER — Ambulatory Visit (INDEPENDENT_AMBULATORY_CARE_PROVIDER_SITE_OTHER): Payer: 59 | Admitting: Orthopedic Surgery

## 2017-12-11 DIAGNOSIS — S82871D Displaced pilon fracture of right tibia, subsequent encounter for closed fracture with routine healing: Secondary | ICD-10-CM | POA: Diagnosis not present

## 2017-12-11 DIAGNOSIS — S82251D Displaced comminuted fracture of shaft of right tibia, subsequent encounter for closed fracture with routine healing: Secondary | ICD-10-CM

## 2017-12-11 MED ORDER — OXYCODONE-ACETAMINOPHEN 5-325 MG PO TABS: 1.0000 | ORAL_TABLET | Freq: Three times a day (TID) | ORAL | 0 refills | Status: DC | PRN
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
## 2017-12-21 ENCOUNTER — Ambulatory Visit (INDEPENDENT_AMBULATORY_CARE_PROVIDER_SITE_OTHER): Payer: 59 | Admitting: Family

## 2017-12-21 ENCOUNTER — Encounter (INDEPENDENT_AMBULATORY_CARE_PROVIDER_SITE_OTHER): Payer: Self-pay | Admitting: Family

## 2017-12-21 DIAGNOSIS — S82251D Displaced comminuted fracture of shaft of right tibia, subsequent encounter for closed fracture with routine healing: Secondary | ICD-10-CM
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
## 2017-12-26 ENCOUNTER — Encounter (INDEPENDENT_AMBULATORY_CARE_PROVIDER_SITE_OTHER): Payer: Self-pay | Admitting: Orthopedic Surgery

## 2017-12-26 ENCOUNTER — Ambulatory Visit (INDEPENDENT_AMBULATORY_CARE_PROVIDER_SITE_OTHER): Payer: 59 | Admitting: Orthopedic Surgery

## 2017-12-26 VITALS — Ht 66.0 in | Wt 220.0 lb

## 2017-12-26 DIAGNOSIS — S82251D Displaced comminuted fracture of shaft of right tibia, subsequent encounter for closed fracture with routine healing: Secondary | ICD-10-CM

## 2017-12-26 DIAGNOSIS — S81801S Unspecified open wound, right lower leg, sequela: Secondary | ICD-10-CM

## 2017-12-26 DIAGNOSIS — S82871D Displaced pilon fracture of right tibia, subsequent encounter for closed fracture with routine healing: Secondary | ICD-10-CM

## 2017-12-26 DIAGNOSIS — L929 Granulomatous disorder of the skin and subcutaneous tissue, unspecified: Secondary | ICD-10-CM | POA: Diagnosis not present

## 2017-12-26 MED ORDER — OXYCODONE-ACETAMINOPHEN 5-325 MG PO TABS: 1.0000 | ORAL_TABLET | Freq: Three times a day (TID) | ORAL | 0 refills | Status: DC | PRN
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
## 2018-01-01 ENCOUNTER — Telehealth (INDEPENDENT_AMBULATORY_CARE_PROVIDER_SITE_OTHER): Payer: Self-pay | Admitting: Orthopedic Surgery

## 2018-01-01 NOTE — Telephone Encounter (Signed)
Carlos Kim called with [facility], patient is having his first appt with them today and she needs the op reports. Please fax to # [phone].

## 2018-01-01 NOTE — Telephone Encounter (Signed)
faxed

## 2018-01-02 ENCOUNTER — Ambulatory Visit (INDEPENDENT_AMBULATORY_CARE_PROVIDER_SITE_OTHER): Payer: 59

## 2018-01-02 ENCOUNTER — Encounter (INDEPENDENT_AMBULATORY_CARE_PROVIDER_SITE_OTHER): Payer: Self-pay | Admitting: Orthopedic Surgery

## 2018-01-02 ENCOUNTER — Ambulatory Visit (INDEPENDENT_AMBULATORY_CARE_PROVIDER_SITE_OTHER): Payer: 59 | Admitting: Orthopedic Surgery

## 2018-01-02 VITALS — Ht 66.0 in | Wt 220.0 lb

## 2018-01-02 DIAGNOSIS — S82871D Displaced pilon fracture of right tibia, subsequent encounter for closed fracture with routine healing: Secondary | ICD-10-CM
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
## 2018-01-14 ENCOUNTER — Telehealth (INDEPENDENT_AMBULATORY_CARE_PROVIDER_SITE_OTHER): Payer: Self-pay | Admitting: Orthopedic Surgery

## 2018-01-14 ENCOUNTER — Other Ambulatory Visit (INDEPENDENT_AMBULATORY_CARE_PROVIDER_SITE_OTHER): Payer: Self-pay | Admitting: Orthopedic Surgery

## 2018-01-14 MED ORDER — OXYCODONE-ACETAMINOPHEN 5-325 MG PO TABS: 1.0000 | ORAL_TABLET | Freq: Three times a day (TID) | ORAL | 0 refills | Status: DC | PRN

## 2018-01-14 NOTE — Telephone Encounter (Signed)
Patient called asking for a refill on his oxycodone. CB # [phone]

## 2018-01-14 NOTE — Telephone Encounter (Signed)
rx written

## 2018-01-14 NOTE — Telephone Encounter (Signed)
Pt is s/p a right ORIF pilon fx and tib shaft fx 11/13/17 requesting refill percocet. Last refill 12/26/17 #20

## 2018-01-14 NOTE — Telephone Encounter (Signed)
Called and advised pt that rx is at front desk for pick up.

## 2018-01-15 ENCOUNTER — Telehealth (INDEPENDENT_AMBULATORY_CARE_PROVIDER_SITE_OTHER): Payer: Self-pay | Admitting: Orthopedic Surgery

## 2018-01-15 NOTE — Telephone Encounter (Signed)
Marisue IvanLiz from [facility] called asking if the signed and dated evaluation from 2/19 has been sent over? CB # [phone]

## 2018-01-15 NOTE — Telephone Encounter (Signed)
Will check to be signed paper work.

## 2018-01-17 ENCOUNTER — Ambulatory Visit (INDEPENDENT_AMBULATORY_CARE_PROVIDER_SITE_OTHER): Payer: 59 | Admitting: Orthopedic Surgery

## 2018-01-22 ENCOUNTER — Encounter (INDEPENDENT_AMBULATORY_CARE_PROVIDER_SITE_OTHER): Payer: Self-pay | Admitting: Orthopedic Surgery

## 2018-01-22 ENCOUNTER — Ambulatory Visit (INDEPENDENT_AMBULATORY_CARE_PROVIDER_SITE_OTHER): Payer: 59 | Admitting: Orthopedic Surgery

## 2018-01-22 ENCOUNTER — Ambulatory Visit (INDEPENDENT_AMBULATORY_CARE_PROVIDER_SITE_OTHER): Payer: 59

## 2018-01-22 VITALS — Ht 66.0 in | Wt 220.0 lb

## 2018-01-22 DIAGNOSIS — S82251D Displaced comminuted fracture of shaft of right tibia, subsequent encounter for closed fracture with routine healing: Secondary | ICD-10-CM

## 2018-01-22 DIAGNOSIS — S82871D Displaced pilon fracture of right tibia, subsequent encounter for closed fracture with routine healing: Secondary | ICD-10-CM | POA: Diagnosis not present

## 2018-01-22 DIAGNOSIS — S82873D Displaced pilon fracture of unspecified tibia, subsequent encounter for closed fracture with routine healing: Secondary | ICD-10-CM

## 2018-01-22 HISTORY — DX: Displaced pilon fracture of unspecified tibia, subsequent encounter for closed fracture with routine healing: S82.873D
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
## 2018-01-28 ENCOUNTER — Telehealth (INDEPENDENT_AMBULATORY_CARE_PROVIDER_SITE_OTHER): Payer: Self-pay | Admitting: Orthopedic Surgery

## 2018-01-28 NOTE — Telephone Encounter (Signed)
Error, other message  °

## 2018-01-28 NOTE — Telephone Encounter (Signed)
Message to medical records to see if this has been signed and faxed. If not I will call for another copy.

## 2018-01-28 NOTE — Telephone Encounter (Signed)
Called and advised for them to fax again because we have not received it. Can you keep your eyes open for this. She states sending now.

## 2018-01-28 NOTE — Telephone Encounter (Signed)
Marisue IvanLiz called again to check on the POC for this patient.

## 2018-01-29 NOTE — Telephone Encounter (Signed)
Done

## 2018-02-04 ENCOUNTER — Telehealth (INDEPENDENT_AMBULATORY_CARE_PROVIDER_SITE_OTHER): Payer: Self-pay | Admitting: Orthopaedic Surgery

## 2018-02-04 NOTE — Telephone Encounter (Signed)
Patient requesting refill on oxycodone-acetaminophen, please advise when ready # [phone]

## 2018-02-05 ENCOUNTER — Other Ambulatory Visit (INDEPENDENT_AMBULATORY_CARE_PROVIDER_SITE_OTHER): Payer: Self-pay | Admitting: Orthopedic Surgery

## 2018-02-05 MED ORDER — OXYCODONE-ACETAMINOPHEN 5-325 MG PO TABS
1.0000 | ORAL_TABLET | Freq: Three times a day (TID) | ORAL | 0 refills | Status: DC | PRN
Start: 2018-02-05 — End: 2018-02-19

## 2018-02-05 NOTE — Telephone Encounter (Signed)
Duda patient °

## 2018-02-05 NOTE — Telephone Encounter (Signed)
rx written

## 2018-02-05 NOTE — Telephone Encounter (Signed)
Pt is s/p right ankle pilon fx 11/13/17. Requesting refill of oxycodone 5/325. Last refill 01/15/18 #30 please advise.

## 2018-02-05 NOTE — Telephone Encounter (Signed)
Called and advised pt that rx is at the front desk for pick up.  

## 2018-02-19 ENCOUNTER — Ambulatory Visit (INDEPENDENT_AMBULATORY_CARE_PROVIDER_SITE_OTHER): Payer: 59 | Admitting: Orthopedic Surgery

## 2018-02-19 ENCOUNTER — Encounter (INDEPENDENT_AMBULATORY_CARE_PROVIDER_SITE_OTHER): Payer: Self-pay | Admitting: Orthopaedic Surgery

## 2018-02-19 ENCOUNTER — Ambulatory Visit (INDEPENDENT_AMBULATORY_CARE_PROVIDER_SITE_OTHER): Payer: 59

## 2018-02-19 DIAGNOSIS — S82871D Displaced pilon fracture of right tibia, subsequent encounter for closed fracture with routine healing: Secondary | ICD-10-CM | POA: Diagnosis not present
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
## 2018-02-26 ENCOUNTER — Other Ambulatory Visit (INDEPENDENT_AMBULATORY_CARE_PROVIDER_SITE_OTHER): Payer: Self-pay | Admitting: Orthopedic Surgery

## 2018-02-26 ENCOUNTER — Telehealth (INDEPENDENT_AMBULATORY_CARE_PROVIDER_SITE_OTHER): Payer: Self-pay | Admitting: Orthopedic Surgery

## 2018-02-26 MED ORDER — OXYCODONE-ACETAMINOPHEN 5-325 MG PO TABS: 1.0000 | ORAL_TABLET | Freq: Three times a day (TID) | ORAL | 0 refills | Status: DC | PRN

## 2018-02-26 NOTE — Telephone Encounter (Signed)
Patient requesting rx refill on percocet. # [phone]

## 2018-02-26 NOTE — Telephone Encounter (Signed)
rx written

## 2018-02-26 NOTE — Telephone Encounter (Signed)
I called to advise pt that rx is at the front desk ready for pick up.

## 2018-02-26 NOTE — Telephone Encounter (Signed)
11/13/17 s/p right ORIF pilon fx requesting refill on percocet 5/325 last refill on 02/05/18 #30

## 2018-03-12 ENCOUNTER — Ambulatory Visit (INDEPENDENT_AMBULATORY_CARE_PROVIDER_SITE_OTHER): Payer: 59 | Admitting: Orthopedic Surgery

## 2018-03-12 ENCOUNTER — Encounter (INDEPENDENT_AMBULATORY_CARE_PROVIDER_SITE_OTHER): Payer: Self-pay | Admitting: Orthopedic Surgery

## 2018-03-12 DIAGNOSIS — M6701 Short Achilles tendon (acquired), right ankle: Secondary | ICD-10-CM

## 2018-03-12 DIAGNOSIS — S82871D Displaced pilon fracture of right tibia, subsequent encounter for closed fracture with routine healing: Secondary | ICD-10-CM

## 2018-03-12 HISTORY — DX: Short Achilles tendon (acquired), right ankle: M67.01
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
## 2018-04-09 ENCOUNTER — Telehealth (INDEPENDENT_AMBULATORY_CARE_PROVIDER_SITE_OTHER): Payer: Self-pay | Admitting: Orthopedic Surgery

## 2018-04-09 NOTE — Telephone Encounter (Signed)
[facility]  Carlos Kim  [phone]   Request  Assigned plan of care  Progress note 03/29/18 Dr.Duda sig needed

## 2018-04-09 NOTE — Telephone Encounter (Signed)
This was done today 

## 2018-06-11 ENCOUNTER — Encounter (INDEPENDENT_AMBULATORY_CARE_PROVIDER_SITE_OTHER): Payer: Self-pay | Admitting: Orthopedic Surgery

## 2018-06-11 ENCOUNTER — Ambulatory Visit (INDEPENDENT_AMBULATORY_CARE_PROVIDER_SITE_OTHER): Payer: 59 | Admitting: Orthopedic Surgery

## 2018-06-11 DIAGNOSIS — S82871D Displaced pilon fracture of right tibia, subsequent encounter for closed fracture with routine healing: Secondary | ICD-10-CM
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
## 2019-09-26 ENCOUNTER — Other Ambulatory Visit: Payer: Self-pay

## 2019-09-26 ENCOUNTER — Ambulatory Visit
Admission: EM | Admit: 2019-09-26 | Discharge: 2019-09-26 | Disposition: A | Discharge status: Home / Self Care | Payer: 59 | Attending: Internal Medicine | Admitting: Internal Medicine

## 2019-09-26 DIAGNOSIS — L03011 Cellulitis of right finger: Secondary | ICD-10-CM

## 2019-09-26 MED ORDER — CEPHALEXIN 500 MG PO CAPS: 500.0000 mg | ORAL_CAPSULE | Freq: Four times a day (QID) | ORAL | 0 refills | Status: AC

## 2019-09-26 MED ORDER — IBUPROFEN 800 MG PO TABS: 800.0000 mg | ORAL_TABLET | Freq: Three times a day (TID) | ORAL | 0 refills | Status: DC

## 2019-09-26 MED ORDER — MUPIROCIN 2 % EX OINT: 1.0000 "application " | TOPICAL_OINTMENT | Freq: Two times a day (BID) | CUTANEOUS | 0 refills | Status: DC
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
## 2019-09-26 NOTE — ED Triage Notes (Signed)
Pt presents to UC w/ c/o right hand finger pain since 2 days. Pt states he cut his nail 3 days ago. Pt has boil on middle finger of right hand.

## 2019-09-26 NOTE — Discharge Instructions (Signed)
Begin keflex 4 times daily for 7 days Bactroban twice daily after soaking Use anti-inflammatories for pain/swelling. You may take up to 800 mg Ibuprofen every 8 hours with food. You may supplement Ibuprofen with Tylenol [phone] mg every 8 hours.   Follow up if not improving

## 2021-11-14 ENCOUNTER — Other Ambulatory Visit: Payer: Self-pay

## 2021-11-14 ENCOUNTER — Encounter: Payer: Self-pay | Admitting: Internal Medicine

## 2021-11-14 ENCOUNTER — Ambulatory Visit (INDEPENDENT_AMBULATORY_CARE_PROVIDER_SITE_OTHER): Payer: Self-pay | Admitting: Internal Medicine

## 2021-11-14 VITALS — BP 158/112 | HR 102 | Resp 18 | Ht 66.0 in | Wt 230.1 lb

## 2021-11-14 DIAGNOSIS — I10 Essential (primary) hypertension: Secondary | ICD-10-CM

## 2021-11-14 DIAGNOSIS — B360 Pityriasis versicolor: Secondary | ICD-10-CM

## 2021-11-14 DIAGNOSIS — F411 Generalized anxiety disorder: Secondary | ICD-10-CM

## 2021-11-14 DIAGNOSIS — G8929 Other chronic pain: Secondary | ICD-10-CM

## 2021-11-14 DIAGNOSIS — M25571 Pain in right ankle and joints of right foot: Secondary | ICD-10-CM

## 2021-11-14 MED ORDER — TELMISARTAN 20 MG PO TABS: 20.0000 mg | ORAL_TABLET | Freq: Every day | ORAL | 1 refills | Status: DC

## 2021-11-14 MED ORDER — KETOCONAZOLE 2 % EX CREA: 1.0000 "application " | TOPICAL_CREAM | Freq: Every day | CUTANEOUS | 0 refills | Status: DC

## 2021-11-14 MED ORDER — HYDROXYZINE PAMOATE 25 MG PO CAPS: 25.0000 mg | ORAL_CAPSULE | Freq: Two times a day (BID) | ORAL | 2 refills | Status: DC | PRN
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
## 2021-11-14 NOTE — Patient Instructions (Signed)
Please start taking Telmisartan for blood pressure. Start following DASH diet and perform moderate exercise/walking at least 150 mins/week.  Please start using Ketoconazole cream for skin rash.  Please start taking Hydroxyzine for insomnia as needed.  Please maintain simple sleep hygiene. - Maintain dark and non-noisy environment in the bedroom. - Please use the bedroom for sleep and sexual activity only. - Do not use electronic devices in the bedroom. - Please take dinner at least 2 hours before bedtime. - Please avoid caffeinated products in the evening, including coffee, soft drinks. - Please try to maintain the regular sleep-wake cycle - Go to bed and wake up at the same time.
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
## 2021-11-18 NOTE — Assessment & Plan Note (Signed)
Due to recent stress at home after losing his father Started Vistaril PRN

## 2021-11-18 NOTE — Assessment & Plan Note (Signed)
Rash around left areolar area Prescribed Ketoconazole cream 

## 2021-11-18 NOTE — Assessment & Plan Note (Signed)
BP Readings from Last 1 Encounters:  11/14/21 (!) 158/112   New onset Started Telmisartan 20 mg QD Recent anxiety and insomnia also could be contributing Counseled for compliance with the medications Advised DASH diet and moderate exercise/walking, at least 150 mins/week

## 2021-11-29 LAB — BASIC METABOLIC PANEL
BUN/Creatinine Ratio: 14 (ref 9–20)
BUN: 13 mg/dL (ref 6–20)
CO2: 21 mmol/L (ref 20–29)
Calcium: 9.4 mg/dL (ref 8.7–10.2)
Chloride: 99 mmol/L (ref 96–106)
Creatinine, Ser: 0.96 mg/dL (ref 0.76–1.27)
Glucose: 94 mg/dL (ref 70–99)
Potassium: 3.8 mmol/L (ref 3.5–5.2)
Sodium: 139 mmol/L (ref 134–144)
eGFR: 109 mL/min/{1.73_m2} (ref >59)

## 2021-12-26 ENCOUNTER — Ambulatory Visit: Payer: No Typology Code available for payment source | Admitting: Internal Medicine

## 2021-12-26 ENCOUNTER — Other Ambulatory Visit: Payer: Self-pay

## 2021-12-26 ENCOUNTER — Encounter: Payer: Self-pay | Admitting: Internal Medicine

## 2021-12-26 VITALS — BP 138/88 | HR 104 | Resp 18 | Ht 67.5 in | Wt 220.0 lb

## 2021-12-26 DIAGNOSIS — I10 Essential (primary) hypertension: Secondary | ICD-10-CM | POA: Diagnosis not present

## 2021-12-26 DIAGNOSIS — F411 Generalized anxiety disorder: Secondary | ICD-10-CM

## 2021-12-26 DIAGNOSIS — E669 Obesity, unspecified: Secondary | ICD-10-CM | POA: Diagnosis not present

## 2021-12-26 DIAGNOSIS — J4520 Mild intermittent asthma, uncomplicated: Secondary | ICD-10-CM

## 2021-12-26 DIAGNOSIS — Z2821 Immunization not carried out because of patient refusal: Secondary | ICD-10-CM

## 2021-12-26 MED ORDER — TELMISARTAN 20 MG PO TABS: 20.0000 mg | ORAL_TABLET | Freq: Every day | ORAL | 1 refills | Status: DC

## 2021-12-26 MED ORDER — ALBUTEROL SULFATE HFA 108 (90 BASE) MCG/ACT IN AERS: 2.0000 | INHALATION_SPRAY | Freq: Four times a day (QID) | RESPIRATORY_TRACT | 5 refills | Status: DC | PRN

## 2021-12-26 NOTE — Assessment & Plan Note (Signed)
Lost 10 lbs since the last visit with diet modification and moderate exercise, encouraged to continue efforts

## 2021-12-26 NOTE — Assessment & Plan Note (Signed)
Due to recent stress at home after losing his father Better with Vistaril PRN
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
## 2021-12-26 NOTE — Patient Instructions (Signed)
Please use Albuterol inhaler as needed for shortness of breath.  Please continue taking other medications as prescribed.

## 2021-12-26 NOTE — Assessment & Plan Note (Signed)
BP Readings from Last 1 Encounters:  12/26/21 138/88   Well-controlled with Telmisartan 20 mg QD Counseled for compliance with the medications Advised DASH diet and moderate exercise/walking, at least 150 mins/week

## 2021-12-26 NOTE — Assessment & Plan Note (Signed)
Started Albuterol inhaler PRN If frequent use required, will add maintenance inhaler

## 2022-05-02 ENCOUNTER — Other Ambulatory Visit: Payer: Self-pay | Admitting: Internal Medicine

## 2022-05-02 ENCOUNTER — Encounter: Payer: No Typology Code available for payment source | Admitting: Internal Medicine

## 2022-05-02 DIAGNOSIS — B360 Pityriasis versicolor: Secondary | ICD-10-CM

## 2022-05-11 ENCOUNTER — Ambulatory Visit (INDEPENDENT_AMBULATORY_CARE_PROVIDER_SITE_OTHER): Payer: Self-pay | Admitting: Internal Medicine

## 2022-05-11 ENCOUNTER — Encounter: Payer: Self-pay | Admitting: Internal Medicine

## 2022-05-11 VITALS — BP 140/88 | HR 89 | Resp 20 | Ht 67.5 in | Wt 222.2 lb

## 2022-05-11 DIAGNOSIS — B360 Pityriasis versicolor: Secondary | ICD-10-CM

## 2022-05-11 DIAGNOSIS — I10 Essential (primary) hypertension: Secondary | ICD-10-CM

## 2022-05-11 DIAGNOSIS — Z0001 Encounter for general adult medical examination with abnormal findings: Secondary | ICD-10-CM

## 2022-05-11 DIAGNOSIS — F411 Generalized anxiety disorder: Secondary | ICD-10-CM

## 2022-05-11 DIAGNOSIS — Z1159 Encounter for screening for other viral diseases: Secondary | ICD-10-CM

## 2022-05-11 MED ORDER — KETOCONAZOLE 2 % EX CREA: TOPICAL_CREAM | Freq: Every day | CUTANEOUS | 0 refills | Status: DC

## 2022-05-11 NOTE — Patient Instructions (Signed)
Please continue taking medications as prescribed.  Please continue taking to follow DASH diet and perform moderate exercise/walking at least 150 mins/week.
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
## 2022-05-11 NOTE — Assessment & Plan Note (Signed)
Better with Vistaril PRN, but had stopped taking it Anxiety leading to uncontrolled BP at times

## 2022-05-11 NOTE — Assessment & Plan Note (Signed)
Rash around left areolar area Prescribed Ketoconazole cream

## 2022-05-11 NOTE — Assessment & Plan Note (Signed)
BP Readings from Last 1 Encounters:  05/11/22 140/88   Well-controlled with Telmisartan 20 mg QD Counseled for compliance with the medications Advised DASH diet and moderate exercise/walking, at least 150 mins/week

## 2022-09-28 ENCOUNTER — Other Ambulatory Visit: Payer: Self-pay | Admitting: Internal Medicine

## 2022-09-28 DIAGNOSIS — I10 Essential (primary) hypertension: Secondary | ICD-10-CM

## 2022-09-29 ENCOUNTER — Other Ambulatory Visit: Payer: Self-pay | Admitting: Internal Medicine

## 2022-09-29 DIAGNOSIS — I10 Essential (primary) hypertension: Secondary | ICD-10-CM

## 2022-11-10 ENCOUNTER — Encounter: Payer: Self-pay | Admitting: Internal Medicine

## 2022-11-10 ENCOUNTER — Ambulatory Visit (INDEPENDENT_AMBULATORY_CARE_PROVIDER_SITE_OTHER): Payer: Self-pay | Admitting: Internal Medicine

## 2022-11-10 VITALS — BP 136/88 | HR 96 | Ht 67.5 in | Wt 213.2 lb

## 2022-11-10 DIAGNOSIS — Z2821 Immunization not carried out because of patient refusal: Secondary | ICD-10-CM

## 2022-11-10 DIAGNOSIS — E669 Obesity, unspecified: Secondary | ICD-10-CM

## 2022-11-10 DIAGNOSIS — I10 Essential (primary) hypertension: Secondary | ICD-10-CM

## 2022-11-10 DIAGNOSIS — F411 Generalized anxiety disorder: Secondary | ICD-10-CM

## 2022-11-10 MED ORDER — TELMISARTAN 20 MG PO TABS: 20.0000 mg | ORAL_TABLET | Freq: Every day | ORAL | 1 refills | Status: DC

## 2022-11-10 NOTE — Assessment & Plan Note (Signed)
Better with Vistaril PRN, but had stopped taking it Anxiety leading to uncontrolled BP at times Would avoid BZD for now

## 2022-11-10 NOTE — Assessment & Plan Note (Signed)
Lost additional 9 lbs since the last visit with diet modification and moderate exercise, encouraged to continue efforts

## 2022-11-10 NOTE — Assessment & Plan Note (Signed)
BP Readings from Last 1 Encounters:  11/10/22 136/88   Well-controlled with Telmisartan 20 mg QD Counseled for compliance with the medications Advised DASH diet and moderate exercise/walking, at least 150 mins/week
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
## 2022-11-10 NOTE — Patient Instructions (Signed)
Please continue taking medications as prescribed.  Please continue to follow low salt diet and perform moderate exercise/walking at least 150 mins/week.  Please apply Cortisol cream over rash.

## 2022-11-11 LAB — BASIC METABOLIC PANEL
BUN/Creatinine Ratio: 11 (ref 9–20)
BUN: 10 mg/dL (ref 6–20)
CO2: 17 mmol/L — ABNORMAL LOW (ref 20–29)
Calcium: 9.3 mg/dL (ref 8.7–10.2)
Chloride: 99 mmol/L (ref 96–106)
Creatinine, Ser: 0.91 mg/dL (ref 0.76–1.27)
Glucose: 110 mg/dL — ABNORMAL HIGH (ref 70–99)
Potassium: 3.2 mmol/L — ABNORMAL LOW (ref 3.5–5.2)
Sodium: 138 mmol/L (ref 134–144)
eGFR: 116 mL/min/{1.73_m2} (ref >59)

## 2023-05-14 ENCOUNTER — Encounter: Payer: Self-pay | Admitting: Internal Medicine

## 2023-05-14 ENCOUNTER — Ambulatory Visit (INDEPENDENT_AMBULATORY_CARE_PROVIDER_SITE_OTHER): Payer: Self-pay | Admitting: Internal Medicine

## 2023-05-14 VITALS — BP 136/84 | HR 96 | Ht 67.5 in | Wt 212.2 lb

## 2023-05-14 DIAGNOSIS — I10 Essential (primary) hypertension: Secondary | ICD-10-CM

## 2023-05-14 DIAGNOSIS — E669 Obesity, unspecified: Secondary | ICD-10-CM

## 2023-05-14 DIAGNOSIS — F411 Generalized anxiety disorder: Secondary | ICD-10-CM

## 2023-05-14 DIAGNOSIS — J4520 Mild intermittent asthma, uncomplicated: Secondary | ICD-10-CM

## 2023-05-14 NOTE — Patient Instructions (Signed)
Please use Flonase or generic Fluticasone nose spray for nasal congestion. Please use humidifier at bedtime.  Please continue taking Telmisartan as prescribed.  Please continue to follow low salt diet and ambulate as tolerated.
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
## 2023-05-16 NOTE — Assessment & Plan Note (Signed)
Had lost additional 9 lbs in the last visit with diet modification and moderate exercise, encouraged to continue efforts

## 2023-05-16 NOTE — Assessment & Plan Note (Addendum)
Well controlled with albuterol inhaler PRN If frequent use required, will add maintenance inhaler

## 2023-05-16 NOTE — Assessment & Plan Note (Signed)
BP Readings from Last 1 Encounters:  05/14/23 136/84   Well-controlled with Telmisartan 20 mg QD Counseled for compliance with the medications Advised DASH diet and moderate exercise/walking, at least 150 mins/week

## 2023-05-16 NOTE — Assessment & Plan Note (Signed)
Better with Vistaril PRN Anxiety leading to uncontrolled BP at times Would avoid BZD for now

## 2023-11-19 ENCOUNTER — Ambulatory Visit: Payer: Self-pay | Admitting: Internal Medicine

## 2023-12-04 ENCOUNTER — Ambulatory Visit: Payer: Medicaid Other | Admitting: Internal Medicine

## 2024-01-08 ENCOUNTER — Ambulatory Visit: Payer: Medicaid Other | Admitting: Internal Medicine

## 2024-02-26 ENCOUNTER — Ambulatory Visit: Payer: Medicaid Other | Admitting: Internal Medicine

## 2024-02-26 VITALS — BP 138/86 | HR 98 | Ht 67.5 in | Wt 190.2 lb

## 2024-02-26 DIAGNOSIS — Z0001 Encounter for general adult medical examination with abnormal findings: Secondary | ICD-10-CM

## 2024-02-26 DIAGNOSIS — I10 Essential (primary) hypertension: Secondary | ICD-10-CM | POA: Diagnosis not present

## 2024-02-26 DIAGNOSIS — E559 Vitamin D deficiency, unspecified: Secondary | ICD-10-CM

## 2024-02-26 DIAGNOSIS — L239 Allergic contact dermatitis, unspecified cause: Secondary | ICD-10-CM | POA: Insufficient documentation

## 2024-02-26 DIAGNOSIS — J309 Allergic rhinitis, unspecified: Secondary | ICD-10-CM | POA: Insufficient documentation

## 2024-02-26 DIAGNOSIS — L219 Seborrheic dermatitis, unspecified: Secondary | ICD-10-CM | POA: Insufficient documentation

## 2024-02-26 DIAGNOSIS — F411 Generalized anxiety disorder: Secondary | ICD-10-CM | POA: Diagnosis not present

## 2024-02-26 DIAGNOSIS — E785 Hyperlipidemia, unspecified: Secondary | ICD-10-CM

## 2024-02-26 DIAGNOSIS — J4520 Mild intermittent asthma, uncomplicated: Secondary | ICD-10-CM

## 2024-02-26 DIAGNOSIS — Z1159 Encounter for screening for other viral diseases: Secondary | ICD-10-CM

## 2024-02-26 MED ORDER — FLUTICASONE PROPIONATE 50 MCG/ACT NA SUSP: 2.0000 | Freq: Every day | NASAL | 1 refills | Status: AC

## 2024-02-26 MED ORDER — KETOCONAZOLE 2 % EX CREA: TOPICAL_CREAM | Freq: Every day | CUTANEOUS | 0 refills | Status: AC

## 2024-02-26 MED ORDER — TRIAMCINOLONE ACETONIDE 0.1 % EX CREA: 1.0000 | TOPICAL_CREAM | Freq: Two times a day (BID) | CUTANEOUS | 0 refills | Status: DC

## 2024-02-26 MED ORDER — ALBUTEROL SULFATE HFA 108 (90 BASE) MCG/ACT IN AERS: 2.0000 | INHALATION_SPRAY | Freq: Four times a day (QID) | RESPIRATORY_TRACT | 1 refills | Status: AC | PRN

## 2024-02-26 NOTE — Assessment & Plan Note (Signed)
 Rash over dorsum of hand likely due to allergic dermatitis, due to exposure to detergents Kenalog cream prescribed

## 2024-02-26 NOTE — Assessment & Plan Note (Signed)
 Has chronic nasal congestion and intermittent nosebleeds Flonase prescribed Can use humidifier and/or vaporizer for nasal congestion

## 2024-02-26 NOTE — Patient Instructions (Addendum)
 Please apply Ketoconazole cream over forehead area and around eyebrow.  Please use Triamcinolone cream over hand rash.  Please use Flonase for nasal congestion and allergies.  Please continue to take medications as prescribed.  Please continue to follow low carb diet and perform moderate exercise/walking at least 150 mins/week.
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
## 2024-02-26 NOTE — Assessment & Plan Note (Addendum)
 BP Readings from Last 1 Encounters:  02/26/24 138/86   Well-controlled without Telmisartan now, DC telmisartan Advised DASH diet and moderate exercise/walking, at least 150 mins/week

## 2024-02-26 NOTE — Assessment & Plan Note (Signed)
Better with Vistaril PRN Anxiety leading to uncontrolled BP at times Would avoid BZD for now

## 2024-02-26 NOTE — Assessment & Plan Note (Signed)

## 2024-02-26 NOTE — Assessment & Plan Note (Signed)
Well controlled with albuterol inhaler PRN If frequent use required, will add maintenance inhaler

## 2024-02-26 NOTE — Assessment & Plan Note (Signed)
 Rash over eyebrows and forehead area likely seborrheic dermatitis Prescribed ketoconazole cream

## 2024-02-27 ENCOUNTER — Telehealth: Payer: Self-pay

## 2024-02-27 NOTE — Telephone Encounter (Signed)
 This was sent to the wrong practice Thank you  Copied from CRM [id number]. Topic: Medical Record Request - Payor/Billing Request >> Feb 27, 2024  9:34 AM Rosaria Common wrote: Reason for CRM: Liana Reding from [facility] is calling to have clinic resubmit the coding for date of service 11/09/2023 because claim was not considered "preventative"(84402, [id number]).Reviewed with [facility].

## 2024-02-27 NOTE — Telephone Encounter (Signed)
 No DOS for stated date. Contact patient for additional details regarding invoice.  Invoice #, Date, and Amount

## 2024-02-28 ENCOUNTER — Telehealth: Payer: Self-pay

## 2024-02-28 NOTE — Telephone Encounter (Signed)
 Copied from CRM [id number]. Topic: General - Call Back - No Documentation >> Feb 28, 2024  1:52 PM Georgeann Kindred wrote: Reason for CRM: Patient returning Yosseline in regards to details regarding invoice. Please contact patient at [phone].

## 2024-02-29 LAB — CMP14+EGFR
ALT: 21 IU/L (ref 0–44)
AST: 23 IU/L (ref 0–40)
Albumin: 5 g/dL (ref 4.1–5.1)
Alkaline Phosphatase: 80 IU/L (ref 44–121)
BUN/Creatinine Ratio: 8 — ABNORMAL LOW (ref 9–20)
BUN: 7 mg/dL (ref 6–20)
Bilirubin Total: 0.5 mg/dL (ref 0.0–1.2)
CO2: 22 mmol/L (ref 20–29)
Calcium: 9.8 mg/dL (ref 8.7–10.2)
Chloride: 99 mmol/L (ref 96–106)
Creatinine, Ser: 0.89 mg/dL (ref 0.76–1.27)
Globulin, Total: 2.8 g/dL (ref 1.5–4.5)
Glucose: 110 mg/dL — ABNORMAL HIGH (ref 70–99)
Potassium: 3.9 mmol/L (ref 3.5–5.2)
Sodium: 138 mmol/L (ref 134–144)
Total Protein: 7.8 g/dL (ref 6.0–8.5)
eGFR: 116 mL/min/{1.73_m2} (ref >59)

## 2024-02-29 LAB — CBC WITH DIFFERENTIAL/PLATELET
Basophils Absolute: 0 10*3/uL (ref 0.0–0.2)
Basos: 1 %
EOS (ABSOLUTE): 0 10*3/uL (ref 0.0–0.4)
Eos: 1 %
Hematocrit: 48.6 % (ref 37.5–51.0)
Hemoglobin: 16.5 g/dL (ref 13.0–17.7)
Immature Grans (Abs): 0 10*3/uL (ref 0.0–0.1)
Immature Granulocytes: 0 %
Lymphocytes Absolute: 1.2 10*3/uL (ref 0.7–3.1)
Lymphs: 29 %
MCH: 30.7 pg (ref 26.6–33.0)
MCHC: 34 g/dL (ref 31.5–35.7)
MCV: 91 fL (ref 79–97)
Monocytes Absolute: 0.3 10*3/uL (ref 0.1–0.9)
Monocytes: 6 %
Neutrophils Absolute: 2.6 10*3/uL (ref 1.4–7.0)
Neutrophils: 63 %
Platelets: 228 10*3/uL (ref 150–450)
RBC: 5.37 x10E6/uL (ref 4.14–5.80)
RDW: 13.1 % (ref 11.6–15.4)
WBC: 4.2 10*3/uL (ref 3.4–10.8)

## 2024-02-29 LAB — LIPID PANEL
Chol/HDL Ratio: 4.3 ratio (ref 0.0–5.0)
Cholesterol, Total: 178 mg/dL (ref 100–199)
HDL: 41 mg/dL (ref >39)
LDL Chol Calc (NIH): 114 mg/dL — ABNORMAL HIGH (ref 0–99)
Triglycerides: 128 mg/dL (ref 0–149)
VLDL Cholesterol Cal: 23 mg/dL (ref 5–40)

## 2024-02-29 LAB — HEPATITIS C ANTIBODY: Hep C Virus Ab: NONREACTIVE

## 2024-02-29 LAB — TSH: TSH: 1.23 u[IU]/mL (ref 0.450–4.500)

## 2024-02-29 LAB — VITAMIN D 25 HYDROXY (VIT D DEFICIENCY, FRACTURES): Vit D, 25-Hydroxy: 18.2 ng/mL — ABNORMAL LOW (ref 30.0–100.0)

## 2024-02-29 NOTE — Telephone Encounter (Signed)
 E2C2 agent should have gathered details per notes. Report CRM and contact patient as advised on previous telephone encounter.  Practice Admin did not contact patient.

## 2024-03-03 NOTE — Telephone Encounter (Signed)
Attempted to contact pt unable to reach him.

## 2024-03-04 NOTE — Telephone Encounter (Signed)
Second attempt; unable to reach pt

## 2024-08-26 ENCOUNTER — Encounter: Payer: Self-pay | Admitting: Internal Medicine

## 2024-08-26 ENCOUNTER — Ambulatory Visit: Admitting: Internal Medicine

## 2024-08-26 VITALS — BP 138/84 | HR 120 | Ht 67.5 in | Wt 182.0 lb

## 2024-08-26 DIAGNOSIS — F411 Generalized anxiety disorder: Secondary | ICD-10-CM

## 2024-08-26 DIAGNOSIS — E559 Vitamin D deficiency, unspecified: Secondary | ICD-10-CM | POA: Diagnosis not present

## 2024-08-26 DIAGNOSIS — I10 Essential (primary) hypertension: Secondary | ICD-10-CM | POA: Diagnosis not present

## 2024-08-26 DIAGNOSIS — J4520 Mild intermittent asthma, uncomplicated: Secondary | ICD-10-CM | POA: Diagnosis not present

## 2024-08-26 DIAGNOSIS — L239 Allergic contact dermatitis, unspecified cause: Secondary | ICD-10-CM

## 2024-08-26 MED ORDER — VITAMIN D (ERGOCALCIFEROL) 1.25 MG (50000 UNIT) PO CAPS: 50000.0000 [IU] | ORAL_CAPSULE | ORAL | 1 refills | Status: AC

## 2024-08-26 MED ORDER — TRIAMCINOLONE ACETONIDE 0.1 % EX CREA: 1.0000 | TOPICAL_CREAM | Freq: Two times a day (BID) | CUTANEOUS | 1 refills | Status: AC

## 2024-08-26 MED ORDER — HYDROXYZINE PAMOATE 25 MG PO CAPS: 25.0000 mg | ORAL_CAPSULE | Freq: Two times a day (BID) | ORAL | 2 refills | Status: AC | PRN

## 2024-08-26 NOTE — Assessment & Plan Note (Signed)
 BP Readings from Last 1 Encounters:  08/26/24 138/84   Well-controlled with diet modification now, DCed telmisartan  Advised DASH diet and moderate exercise/walking, at least 150 mins/week

## 2024-08-26 NOTE — Patient Instructions (Addendum)
 Please take Vistaril  as needed for anxiety.  Please start taking Vitamin D  50,000 IU once weekly.  Please continue to follow low salt diet and perform moderate exercise/walking as tolerated.

## 2024-08-26 NOTE — Assessment & Plan Note (Signed)
 Rash over dorsum of hand likely due to allergic dermatitis, due to exposure to detergents Kenalog cream prescribed

## 2024-08-26 NOTE — Assessment & Plan Note (Signed)
Better with Vistaril PRN Anxiety leading to uncontrolled BP at times Would avoid BZD for now
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
## 2024-08-26 NOTE — Assessment & Plan Note (Signed)
 Last vitamin D  Lab Results  Component Value Date   VD25OH 18.2 (L) 02/26/2024   Started vitamin D  50,000 IU QW

## 2024-08-26 NOTE — Assessment & Plan Note (Signed)
Well controlled with albuterol inhaler PRN If frequent use required, will add maintenance inhaler

## 2025-02-24 ENCOUNTER — Ambulatory Visit: Admitting: Internal Medicine
